# Patient Record
Sex: Female | Born: 1945 | Race: White | Hispanic: No | Marital: Married | Smoking: Never smoker
Health system: Southern US, Community
[De-identification: ages and names within clinical notes are randomized; demographics above are authoritative.]

## PROBLEM LIST (undated history)

## (undated) DIAGNOSIS — K219 Gastro-esophageal reflux disease without esophagitis: Secondary | ICD-10-CM

## (undated) DIAGNOSIS — D649 Anemia, unspecified: Secondary | ICD-10-CM

## (undated) DIAGNOSIS — I639 Cerebral infarction, unspecified: Secondary | ICD-10-CM

## (undated) DIAGNOSIS — C801 Malignant (primary) neoplasm, unspecified: Secondary | ICD-10-CM

## (undated) DIAGNOSIS — I10 Essential (primary) hypertension: Secondary | ICD-10-CM

## (undated) DIAGNOSIS — E785 Hyperlipidemia, unspecified: Secondary | ICD-10-CM

## (undated) DIAGNOSIS — Z972 Presence of dental prosthetic device (complete) (partial): Secondary | ICD-10-CM

## (undated) HISTORY — PX: BACK SURGERY: SHX140

## (undated) HISTORY — PX: HEMORROIDECTOMY: SUR656

## (undated) HISTORY — DX: Anemia, unspecified: D64.9

---

## 2005-07-06 ENCOUNTER — Emergency Department: Payer: Self-pay | Admitting: Emergency Medicine

## 2005-07-06 ENCOUNTER — Other Ambulatory Visit: Payer: Self-pay

## 2005-08-20 ENCOUNTER — Ambulatory Visit: Payer: Self-pay | Admitting: Family Medicine

## 2006-01-01 ENCOUNTER — Ambulatory Visit: Payer: Self-pay | Admitting: Unknown Physician Specialty

## 2006-11-23 ENCOUNTER — Emergency Department: Payer: Self-pay | Admitting: Emergency Medicine

## 2007-04-06 ENCOUNTER — Ambulatory Visit: Payer: Self-pay | Admitting: Unknown Physician Specialty

## 2007-06-28 ENCOUNTER — Ambulatory Visit: Payer: Self-pay | Admitting: Internal Medicine

## 2007-06-28 ENCOUNTER — Other Ambulatory Visit: Payer: Self-pay

## 2007-06-29 ENCOUNTER — Ambulatory Visit: Payer: Self-pay | Admitting: Internal Medicine

## 2007-07-19 ENCOUNTER — Ambulatory Visit: Payer: Self-pay | Admitting: Gastroenterology

## 2007-07-20 ENCOUNTER — Ambulatory Visit: Payer: Self-pay | Admitting: Gastroenterology

## 2008-04-19 ENCOUNTER — Ambulatory Visit: Payer: Self-pay | Admitting: Unknown Physician Specialty

## 2008-08-10 ENCOUNTER — Ambulatory Visit: Payer: Self-pay | Admitting: Family Medicine

## 2008-08-18 ENCOUNTER — Ambulatory Visit: Payer: Self-pay | Admitting: Family Medicine

## 2009-06-13 ENCOUNTER — Ambulatory Visit: Payer: Self-pay | Admitting: Unknown Physician Specialty

## 2010-09-24 ENCOUNTER — Observation Stay: Payer: Self-pay | Admitting: Internal Medicine

## 2010-12-17 ENCOUNTER — Ambulatory Visit: Payer: Self-pay | Admitting: Family Medicine

## 2011-05-06 ENCOUNTER — Ambulatory Visit: Payer: Self-pay

## 2011-06-15 ENCOUNTER — Ambulatory Visit: Payer: Self-pay | Admitting: Internal Medicine

## 2011-10-15 ENCOUNTER — Ambulatory Visit: Payer: Self-pay | Admitting: Internal Medicine

## 2011-11-17 ENCOUNTER — Ambulatory Visit: Payer: Self-pay | Admitting: Internal Medicine

## 2011-11-17 LAB — CBC CANCER CENTER
Basophil #: 0 x10 3/mm (ref 0.0–0.1)
Eosinophil %: 5.2 %
HGB: 12.2 g/dL (ref 12.0–16.0)
Monocyte #: 0.4 x10 3/mm (ref 0.0–0.7)
Monocyte %: 8.1 %
Neutrophil %: 56.4 %
Platelet: 218 x10 3/mm (ref 150–440)
RDW: 11.8 % (ref 11.5–14.5)
WBC: 4.6 x10 3/mm (ref 3.6–11.0)

## 2011-11-17 LAB — RETICULOCYTES: Absolute Retic Count: 0.085 10*6/uL — ABNORMAL HIGH (ref 0.024–0.084)

## 2011-11-17 LAB — IRON AND TIBC
Iron Bind.Cap.(Total): 286 ug/dL (ref 250–450)
Iron Saturation: 21 %
Iron: 61 ug/dL (ref 50–170)

## 2011-11-17 LAB — FERRITIN: Ferritin (ARMC): 152 ng/mL (ref 8–388)

## 2011-12-08 LAB — CBC CANCER CENTER
Basophil #: 0 x10 3/mm (ref 0.0–0.1)
Basophil %: 0.9 %
Eosinophil #: 0.2 x10 3/mm (ref 0.0–0.7)
Eosinophil %: 5.5 %
Lymphocyte #: 1.2 x10 3/mm (ref 1.0–3.6)
Lymphocyte %: 28.9 %
MCH: 31.9 pg (ref 26.0–34.0)
MCHC: 32.8 g/dL (ref 32.0–36.0)
MCV: 97.1 fL (ref 80–100)
Monocyte %: 8.5 %
Platelet: 229 x10 3/mm (ref 150–440)
WBC: 4.3 x10 3/mm (ref 3.6–11.0)

## 2011-12-08 LAB — IRON AND TIBC
Iron Bind.Cap.(Total): 288 ug/dL (ref 250–450)
Iron: 75 ug/dL (ref 50–170)
Unbound Iron-Bind.Cap.: 213 ug/dL

## 2011-12-08 LAB — OCCULT BLOOD X 1 CARD TO LAB, STOOL: Occult Blood, Feces: NEGATIVE

## 2011-12-14 ENCOUNTER — Ambulatory Visit: Payer: Self-pay | Admitting: Internal Medicine

## 2012-02-02 ENCOUNTER — Ambulatory Visit: Payer: Self-pay | Admitting: Internal Medicine

## 2012-02-02 LAB — CBC CANCER CENTER
Basophil #: 0 x10 3/mm (ref 0.0–0.1)
Basophil %: 1.2 %
Eosinophil #: 0.3 x10 3/mm (ref 0.0–0.7)
Eosinophil %: 6.7 %
HCT: 37.8 % (ref 35.0–47.0)
HGB: 12.7 g/dL (ref 12.0–16.0)
Lymphocyte #: 1.3 x10 3/mm (ref 1.0–3.6)
MCH: 32.6 pg (ref 26.0–34.0)
MCHC: 33.5 g/dL (ref 32.0–36.0)
MCV: 97 fL (ref 80–100)
Monocyte #: 0.4 x10 3/mm (ref 0.2–0.9)
Monocyte %: 11 %
Neutrophil #: 1.8 x10 3/mm (ref 1.4–6.5)
RDW: 12.2 % (ref 11.5–14.5)

## 2012-02-03 LAB — IRON AND TIBC
Iron: 73 ug/dL (ref 50–170)
Unbound Iron-Bind.Cap.: 217 ug/dL

## 2012-02-13 ENCOUNTER — Ambulatory Visit: Payer: Self-pay | Admitting: Internal Medicine

## 2012-03-16 ENCOUNTER — Ambulatory Visit: Payer: Self-pay | Admitting: Internal Medicine

## 2012-03-31 LAB — OCCULT BLOOD X 1 CARD TO LAB, STOOL
Occult Blood, Feces: NEGATIVE
Occult Blood, Feces: NEGATIVE
Occult Blood, Feces: NEGATIVE

## 2012-04-14 ENCOUNTER — Ambulatory Visit: Payer: Self-pay | Admitting: Internal Medicine

## 2013-04-19 ENCOUNTER — Ambulatory Visit: Payer: Self-pay | Admitting: Family Medicine

## 2014-02-23 DIAGNOSIS — Z85828 Personal history of other malignant neoplasm of skin: Secondary | ICD-10-CM | POA: Insufficient documentation

## 2014-09-11 DIAGNOSIS — F32A Depression, unspecified: Secondary | ICD-10-CM | POA: Insufficient documentation

## 2014-10-15 ENCOUNTER — Emergency Department: Payer: Self-pay | Admitting: Emergency Medicine

## 2014-10-15 LAB — CBC
HCT: 36.9 % (ref 35.0–47.0)
HGB: 12.1 g/dL (ref 12.0–16.0)
MCH: 31.5 pg (ref 26.0–34.0)
MCHC: 32.9 g/dL (ref 32.0–36.0)
MCV: 96 fL (ref 80–100)
Platelet: 198 10*3/uL (ref 150–440)
RBC: 3.85 10*6/uL (ref 3.80–5.20)
RDW: 13 % (ref 11.5–14.5)
WBC: 5.5 10*3/uL (ref 3.6–11.0)

## 2014-10-15 LAB — BASIC METABOLIC PANEL
ANION GAP: 5 — AB (ref 7–16)
BUN: 12 mg/dL (ref 7–18)
CO2: 28 mmol/L (ref 21–32)
Calcium, Total: 8.8 mg/dL (ref 8.5–10.1)
Chloride: 106 mmol/L (ref 98–107)
Creatinine: 0.86 mg/dL (ref 0.60–1.30)
EGFR (African American): >60
Glucose: 91 mg/dL (ref 65–99)
Osmolality: 277 (ref 275–301)
Potassium: 3.6 mmol/L (ref 3.5–5.1)
Sodium: 139 mmol/L (ref 136–145)

## 2014-10-15 LAB — HEPATIC FUNCTION PANEL A (ARMC)
ALBUMIN: 3.4 g/dL (ref 3.4–5.0)
ALT: 15 U/L (ref 14–63)
Alkaline Phosphatase: 68 U/L (ref 46–116)
BILIRUBIN TOTAL: 0.2 mg/dL (ref 0.2–1.0)
Bilirubin, Direct: <0.1 mg/dL (ref 0.0–0.2)
SGOT(AST): 24 U/L (ref 15–37)
TOTAL PROTEIN: 6.8 g/dL (ref 6.4–8.2)

## 2014-10-15 LAB — TROPONIN I: Troponin-I: <0.02 ng/mL

## 2014-10-15 LAB — LIPASE, BLOOD: Lipase: 91 U/L (ref 73–393)

## 2014-10-30 DIAGNOSIS — R079 Chest pain, unspecified: Secondary | ICD-10-CM | POA: Insufficient documentation

## 2014-12-04 ENCOUNTER — Ambulatory Visit: Payer: Self-pay | Admitting: Cardiology

## 2015-02-05 ENCOUNTER — Other Ambulatory Visit: Payer: Self-pay | Admitting: Family Medicine

## 2015-02-05 DIAGNOSIS — Z1231 Encounter for screening mammogram for malignant neoplasm of breast: Secondary | ICD-10-CM

## 2015-05-31 ENCOUNTER — Encounter: Payer: Self-pay | Admitting: *Deleted

## 2015-06-03 ENCOUNTER — Encounter
Admission: RE | Disposition: A | Discharge status: Home / Self Care | Payer: Self-pay | Source: Ambulatory Visit | Attending: Gastroenterology

## 2015-06-03 ENCOUNTER — Ambulatory Visit
Admission: RE | Admit: 2015-06-03 | Discharge: 2015-06-03 | Disposition: A | Discharge status: Home / Self Care | Payer: Medicare HMO | Source: Ambulatory Visit | Attending: Gastroenterology | Admitting: Gastroenterology

## 2015-06-03 ENCOUNTER — Encounter: Payer: Self-pay | Admitting: Anesthesiology

## 2015-06-03 ENCOUNTER — Ambulatory Visit: Payer: Medicare HMO | Admitting: Anesthesiology

## 2015-06-03 DIAGNOSIS — K219 Gastro-esophageal reflux disease without esophagitis: Secondary | ICD-10-CM | POA: Insufficient documentation

## 2015-06-03 DIAGNOSIS — Z7982 Long term (current) use of aspirin: Secondary | ICD-10-CM | POA: Diagnosis not present

## 2015-06-03 DIAGNOSIS — Z1211 Encounter for screening for malignant neoplasm of colon: Secondary | ICD-10-CM | POA: Insufficient documentation

## 2015-06-03 DIAGNOSIS — Z79899 Other long term (current) drug therapy: Secondary | ICD-10-CM | POA: Diagnosis not present

## 2015-06-03 DIAGNOSIS — I10 Essential (primary) hypertension: Secondary | ICD-10-CM | POA: Insufficient documentation

## 2015-06-03 DIAGNOSIS — E785 Hyperlipidemia, unspecified: Secondary | ICD-10-CM | POA: Insufficient documentation

## 2015-06-03 HISTORY — PX: COLONOSCOPY WITH PROPOFOL: SHX5780

## 2015-06-03 HISTORY — DX: Hyperlipidemia, unspecified: E78.5

## 2015-06-03 HISTORY — DX: Gastro-esophageal reflux disease without esophagitis: K21.9

## 2015-06-03 HISTORY — DX: Essential (primary) hypertension: I10

## 2015-06-03 SURGERY — COLONOSCOPY WITH PROPOFOL
Anesthesia: General

## 2015-06-03 MED ORDER — SODIUM CHLORIDE 0.9 % IV SOLN: INTRAVENOUS | Status: DC

## 2015-06-03 MED ORDER — LIDOCAINE HCL (CARDIAC) 20 MG/ML IV SOLN
INTRAVENOUS | Status: DC | PRN
  Administered 2015-06-03: 60 mg via INTRAVENOUS

## 2015-06-03 MED ORDER — PROPOFOL 10 MG/ML IV BOLUS
INTRAVENOUS | Status: DC | PRN
  Administered 2015-06-03: 50 mg via INTRAVENOUS

## 2015-06-03 MED ORDER — SODIUM CHLORIDE 0.9 % IV SOLN
INTRAVENOUS | Status: DC
Start: 2015-06-03 — End: 2015-06-03
  Administered 2015-06-03: 1000 mL via INTRAVENOUS

## 2015-06-03 MED ORDER — PROPOFOL INFUSION 10 MG/ML OPTIME
INTRAVENOUS | Status: DC | PRN
  Administered 2015-06-03: 140 ug/kg/min via INTRAVENOUS

## 2015-06-03 MED ORDER — PROPOFOL INFUSION 10 MG/ML OPTIME: INTRAVENOUS | Status: DC | PRN

## 2015-06-03 NOTE — H&P (Signed)
    Primary Care Physician:  Sherrin Daisy, MD Primary Gastroenterologist:  Dr. Candace Cruise  Pre-Procedure History & Physical: HPI:  Kelsey Ibarra is a 69 y.o. female is here for an colonoscopy.   Past Medical History  Diagnosis Date  . Hypertension   . GERD (gastroesophageal reflux disease)   . Hyperlipidemia     No past surgical history on file.  Prior to Admission medications   Medication Sig Start Date End Date Taking? Authorizing Provider  acetaminophen-codeine (TYLENOL #4) 300-60 MG per tablet Take 1 tablet by mouth every 4 (four) hours as needed for pain.   Yes Historical Provider, MD  aspirin 325 MG tablet Take 325 mg by mouth daily.   Yes Historical Provider, MD  benazepril (LOTENSIN) 20 MG tablet Take 20 mg by mouth daily.   Yes Historical Provider, MD  ergocalciferol (VITAMIN D2) 50000 UNITS capsule Take 50,000 Units by mouth once a week.   Yes Historical Provider, MD  meclizine (ANTIVERT) 25 MG tablet Take 25 mg by mouth 3 (three) times daily as needed for dizziness.   Yes Historical Provider, MD  nitroGLYCERIN (NITROSTAT) 0.4 MG SL tablet Place 0.4 mg under the tongue every 5 (five) minutes as needed for chest pain.   Yes Historical Provider, MD    Allergies as of 04/25/2015  . (Not on File)    No family history on file.  Social History   Social History  . Marital Status: Widowed    Spouse Name: N/A  . Number of Children: N/A  . Years of Education: N/A   Occupational History  . Not on file.   Social History Main Topics  . Smoking status: Not on file  . Smokeless tobacco: Not on file  . Alcohol Use: Not on file  . Drug Use: Not on file  . Sexual Activity: Not on file   Other Topics Concern  . Not on file   Social History Narrative  . No narrative on file    Review of Systems: See HPI, otherwise negative ROS  Physical Exam: There were no vitals taken for this visit. General:   Alert,  pleasant and cooperative in NAD Head:  Normocephalic and  atraumatic. Neck:  Supple; no masses or thyromegaly. Lungs:  Clear throughout to auscultation.    Heart:  Regular rate and rhythm. Abdomen:  Soft, nontender and nondistended. Normal bowel sounds, without guarding, and without rebound.   Neurologic:  Alert and  oriented x4;  grossly normal neurologically.  Impression/Plan: Kelsey Ibarra is here for an colonoscopy to be performed for screening. Risks, benefits, limitations, and alternatives regarding colonoscopy have been reviewed with the patient.  Questions have been answered.  All parties agreeable.   Kelsey Ibarra, Kelsey Dawn, MD  06/03/2015, 9:00 AM

## 2015-06-03 NOTE — Op Note (Signed)
[facility] Patient Name: Hampton Cost Santoli Procedure Date: 06/03/2015 9:21 AM MRN: [id number] Account #: [id number] Date of Birth: 1946-07-15 Admit Type: Outpatient Age: 69 Room: [facility] ENDO ROOM 4 Gender: Female Note Status: Finalized Procedure:         Colonoscopy Indications:       Screening for colorectal malignant neoplasm Providers:         Lupita Dawn. Candace Cruise, MD Referring MD:      Forest Gleason Md, MD (Referring MD) Medicines:         Monitored Anesthesia Care Complications:     No immediate complications. Procedure:         Pre-Anesthesia Assessment:                    - Prior to the procedure, a History and Physical was                     performed, and patient medications, allergies and                     sensitivities were reviewed. The patient's tolerance of                     previous anesthesia was reviewed.                    - The risks and benefits of the procedure and the sedation                     options and risks were discussed with the patient. All                     questions were answered and informed consent was obtained.                    - After reviewing the risks and benefits, the patient was                     deemed in satisfactory condition to undergo the procedure.                    After obtaining informed consent, the colonoscope was                     passed under direct vision. Throughout the procedure, the                     patient's blood pressure, pulse, and oxygen saturations                     were monitored continuously. The Colonoscope was                     introduced through the anus and advanced to the the cecum,                     identified by appendiceal orifice and ileocecal valve. The                     colonoscopy was performed without difficulty. The patient                     tolerated the procedure well. The quality of the bowel  preparation was good. Findings:  The colon (entire examined portion) appeared normal. Impression:        - The entire examined colon is normal.                    - No specimens collected. Recommendation:    - Discharge patient to home.                    - The findings and recommendations were discussed with the                     patient. Procedure Code(s): --- Professional ---                    [phone], Colonoscopy, flexible; diagnostic, including                     collection of specimen(s) by brushing or washing, when                     performed (separate procedure) Diagnosis Code(s): --- Professional ---                    Z12.11, Encounter for screening for malignant neoplasm of                     colon CPT copyright 2014 American Medical Association. All rights reserved. The codes documented in this report are preliminary and upon coder review may  be revised to meet current compliance requirements. Hulen Luster, MD 06/03/2015 9:55:15 AM This report has been signed electronically. Number of Addenda: 0 Note Initiated On: 06/03/2015 9:21 AM Scope Withdrawal Time: 0 hours 6 minutes 55 seconds  Total Procedure Duration: 0 hours 10 minutes 45 seconds       [facility]

## 2015-06-03 NOTE — Transfer of Care (Signed)
Immediate Anesthesia Transfer of Care Note  Patient: Kelsey Ibarra Kleinert  Procedure(s) Performed: Procedure(s): COLONOSCOPY WITH PROPOFOL (N/A)  Patient Location: PACU  Anesthesia Type:General  Level of Consciousness: awake, alert  and oriented  Airway & Oxygen Therapy: Patient Spontanous Breathing and Patient connected to nasal cannula oxygen  Post-op Assessment: Report given to RN and Post -op Vital signs reviewed and stable  Post vital signs: Reviewed and stable  Last Vitals:  Filed Vitals:   06/03/15 1019  BP: 100/60  Pulse: 70  Temp: 36.3 C  Resp: 11    Complications: No apparent anesthesia complications

## 2015-06-03 NOTE — Anesthesia Preprocedure Evaluation (Signed)
Anesthesia Evaluation  Patient identified by MRN, date of birth, ID band Patient awake    Reviewed: Allergy & Precautions, H&P , NPO status , Patient's Chart, lab work & pertinent test results, reviewed documented beta blocker date and time   History of Anesthesia Complications Negative for: history of anesthetic complications  Airway Mallampati: III  TM Distance: >3 FB Neck ROM: full    Dental  (+) Poor Dentition   Pulmonary neg pulmonary ROS,    Pulmonary exam normal breath sounds clear to auscultation       Cardiovascular Exercise Tolerance: Good hypertension, On Medications (-) angina+ Past MI (per patient she "had a light one, but they didn't do anything about it and it didn't show up afterwards.")  (-) CAD, (-) Cardiac Stents and (-) CABG Normal cardiovascular exam(-) dysrhythmias  Rhythm:regular Rate:Normal     Neuro/Psych negative neurological ROS  negative psych ROS   GI/Hepatic Neg liver ROS, GERD  Medicated and Controlled,  Endo/Other  negative endocrine ROS  Renal/GU negative Renal ROS  negative genitourinary   Musculoskeletal   Abdominal   Peds  Hematology negative hematology ROS (+)   Anesthesia Other Findings Past Medical History:   Hypertension                                                 GERD (gastroesophageal reflux disease)                       Hyperlipidemia                                               Reproductive/Obstetrics negative OB ROS                             Anesthesia Physical Anesthesia Plan  ASA: II  Anesthesia Plan: General   Post-op Pain Management:    Induction:   Airway Management Planned:   Additional Equipment:   Intra-op Plan:   Post-operative Plan:   Informed Consent: I have reviewed the patients History and Physical, chart, labs and discussed the procedure including the risks, benefits and alternatives for the proposed  anesthesia with the patient or authorized representative who has indicated his/her understanding and acceptance.   Dental Advisory Given  Plan Discussed with: Anesthesiologist, CRNA and Surgeon  Anesthesia Plan Comments:         Anesthesia Quick Evaluation

## 2015-06-04 NOTE — Anesthesia Postprocedure Evaluation (Signed)
  Anesthesia Post-op Note  Patient: Kelsey Ibarra  Procedure(s) Performed: Procedure(s): COLONOSCOPY WITH PROPOFOL (N/A)  Anesthesia type:General  Patient location: PACU  Post pain: Pain level controlled  Post assessment: Post-op Vital signs reviewed, Patient's Cardiovascular Status Stable, Respiratory Function Stable, Patent Airway and No signs of Nausea or vomiting  Post vital signs: Reviewed and stable  Last Vitals:  Filed Vitals:   06/03/15 1050  BP: 138/67  Pulse: 68  Temp:   Resp: 12    Level of consciousness: awake, alert  and patient cooperative  Complications: No apparent anesthesia complications

## 2015-06-05 ENCOUNTER — Encounter: Payer: Self-pay | Admitting: Gastroenterology

## 2016-01-23 ENCOUNTER — Ambulatory Visit
Admission: RE | Admit: 2016-01-23 | Discharge: 2016-01-23 | Disposition: A | Discharge status: Home / Self Care | Payer: Medicare HMO | Source: Ambulatory Visit | Attending: Family Medicine | Admitting: Family Medicine

## 2016-01-23 DIAGNOSIS — Z1231 Encounter for screening mammogram for malignant neoplasm of breast: Secondary | ICD-10-CM | POA: Diagnosis not present

## 2016-01-23 HISTORY — DX: Malignant (primary) neoplasm, unspecified: C80.1

## 2016-12-04 ENCOUNTER — Other Ambulatory Visit: Payer: Self-pay | Admitting: Family Medicine

## 2016-12-04 DIAGNOSIS — Z1231 Encounter for screening mammogram for malignant neoplasm of breast: Secondary | ICD-10-CM

## 2017-01-25 ENCOUNTER — Ambulatory Visit
Admission: RE | Admit: 2017-01-25 | Discharge: 2017-01-25 | Disposition: A | Discharge status: Home / Self Care | Payer: Medicare HMO | Source: Ambulatory Visit | Attending: Family Medicine | Admitting: Family Medicine

## 2017-01-25 DIAGNOSIS — Z1231 Encounter for screening mammogram for malignant neoplasm of breast: Secondary | ICD-10-CM | POA: Insufficient documentation

## 2017-01-26 ENCOUNTER — Ambulatory Visit: Payer: Medicare HMO

## 2019-03-15 ENCOUNTER — Other Ambulatory Visit: Payer: Self-pay | Admitting: Family Medicine

## 2019-03-15 DIAGNOSIS — Z1231 Encounter for screening mammogram for malignant neoplasm of breast: Secondary | ICD-10-CM

## 2019-04-05 ENCOUNTER — Other Ambulatory Visit: Payer: Self-pay

## 2019-04-05 ENCOUNTER — Ambulatory Visit
Admission: RE | Admit: 2019-04-05 | Discharge: 2019-04-05 | Disposition: A | Discharge status: Home / Self Care | Payer: Medicare HMO | Source: Ambulatory Visit | Attending: Family Medicine | Admitting: Family Medicine

## 2019-04-05 DIAGNOSIS — Z1231 Encounter for screening mammogram for malignant neoplasm of breast: Secondary | ICD-10-CM | POA: Insufficient documentation

## 2019-09-05 ENCOUNTER — Ambulatory Visit
Admission: RE | Admit: 2019-09-05 | Discharge: 2019-09-05 | Disposition: A | Discharge status: Home / Self Care | Payer: Medicare HMO | Source: Ambulatory Visit | Attending: Family Medicine | Admitting: Family Medicine

## 2019-09-05 ENCOUNTER — Other Ambulatory Visit: Payer: Self-pay

## 2019-09-05 ENCOUNTER — Other Ambulatory Visit: Payer: Self-pay | Admitting: Family Medicine

## 2019-09-05 ENCOUNTER — Ambulatory Visit
Admission: RE | Admit: 2019-09-05 | Discharge: 2019-09-05 | Disposition: A | Discharge status: Home / Self Care | Payer: Medicare HMO | Attending: Family Medicine | Admitting: Family Medicine

## 2019-09-05 DIAGNOSIS — M79674 Pain in right toe(s): Secondary | ICD-10-CM | POA: Insufficient documentation

## 2020-05-22 ENCOUNTER — Other Ambulatory Visit: Payer: Self-pay | Admitting: Family Medicine

## 2020-05-22 DIAGNOSIS — Z1231 Encounter for screening mammogram for malignant neoplasm of breast: Secondary | ICD-10-CM

## 2020-05-28 ENCOUNTER — Ambulatory Visit
Admission: RE | Admit: 2020-05-28 | Discharge: 2020-05-28 | Disposition: A | Discharge status: Home / Self Care | Payer: Medicare HMO | Source: Ambulatory Visit | Attending: Family Medicine | Admitting: Family Medicine

## 2020-05-28 ENCOUNTER — Encounter (INDEPENDENT_AMBULATORY_CARE_PROVIDER_SITE_OTHER): Payer: Self-pay

## 2020-05-28 ENCOUNTER — Other Ambulatory Visit: Payer: Self-pay

## 2020-05-28 DIAGNOSIS — Z1231 Encounter for screening mammogram for malignant neoplasm of breast: Secondary | ICD-10-CM | POA: Insufficient documentation

## 2021-06-16 ENCOUNTER — Ambulatory Visit
Admission: RE | Admit: 2021-06-16 | Discharge: 2021-06-16 | Disposition: A | Discharge status: Home / Self Care | Payer: Medicare HMO | Source: Ambulatory Visit | Attending: Family Medicine | Admitting: Family Medicine

## 2021-06-16 ENCOUNTER — Other Ambulatory Visit: Payer: Self-pay | Admitting: Family Medicine

## 2021-06-16 ENCOUNTER — Other Ambulatory Visit: Payer: Self-pay

## 2021-06-16 ENCOUNTER — Ambulatory Visit
Admission: RE | Admit: 2021-06-16 | Discharge: 2021-06-16 | Disposition: A | Discharge status: Home / Self Care | Payer: Medicare HMO | Attending: Family Medicine | Admitting: Family Medicine

## 2021-06-16 DIAGNOSIS — M25532 Pain in left wrist: Secondary | ICD-10-CM

## 2021-07-07 ENCOUNTER — Other Ambulatory Visit: Payer: Self-pay | Admitting: Family Medicine

## 2021-07-07 DIAGNOSIS — Z1231 Encounter for screening mammogram for malignant neoplasm of breast: Secondary | ICD-10-CM

## 2021-07-30 ENCOUNTER — Ambulatory Visit
Admission: RE | Admit: 2021-07-30 | Discharge: 2021-07-30 | Disposition: A | Discharge status: Home / Self Care | Payer: Medicare HMO | Source: Ambulatory Visit | Attending: Family Medicine | Admitting: Family Medicine

## 2021-07-30 ENCOUNTER — Other Ambulatory Visit: Payer: Self-pay

## 2021-07-30 DIAGNOSIS — Z1231 Encounter for screening mammogram for malignant neoplasm of breast: Secondary | ICD-10-CM | POA: Insufficient documentation

## 2021-11-28 ENCOUNTER — Encounter: Payer: Self-pay | Admitting: *Deleted

## 2021-12-02 ENCOUNTER — Telehealth: Payer: Self-pay

## 2021-12-02 NOTE — Telephone Encounter (Signed)
Called patient to gp over any question for NP appt with Dr. Tasia Catchings at 3:15 on 3/22. Patient did not answer. Left message to call back if she has any questions or concerns.  ?

## 2021-12-03 ENCOUNTER — Inpatient Hospital Stay: Payer: Medicare HMO | Attending: Oncology | Admitting: Oncology

## 2021-12-03 ENCOUNTER — Other Ambulatory Visit: Payer: Self-pay

## 2021-12-03 ENCOUNTER — Inpatient Hospital Stay: Payer: Medicare HMO

## 2021-12-03 ENCOUNTER — Encounter: Payer: Self-pay | Admitting: Oncology

## 2021-12-03 VITALS — BP 137/59 | HR 75 | Temp 97.5°F | Resp 16 | Wt 110.8 lb

## 2021-12-03 DIAGNOSIS — Z85828 Personal history of other malignant neoplasm of skin: Secondary | ICD-10-CM | POA: Diagnosis not present

## 2021-12-03 DIAGNOSIS — K219 Gastro-esophageal reflux disease without esophagitis: Secondary | ICD-10-CM | POA: Insufficient documentation

## 2021-12-03 DIAGNOSIS — E785 Hyperlipidemia, unspecified: Secondary | ICD-10-CM | POA: Insufficient documentation

## 2021-12-03 DIAGNOSIS — R768 Other specified abnormal immunological findings in serum: Secondary | ICD-10-CM | POA: Diagnosis not present

## 2021-12-03 DIAGNOSIS — N1832 Chronic kidney disease, stage 3b: Secondary | ICD-10-CM | POA: Insufficient documentation

## 2021-12-03 DIAGNOSIS — D649 Anemia, unspecified: Secondary | ICD-10-CM

## 2021-12-03 DIAGNOSIS — I10 Essential (primary) hypertension: Secondary | ICD-10-CM | POA: Insufficient documentation

## 2021-12-03 DIAGNOSIS — Z803 Family history of malignant neoplasm of breast: Secondary | ICD-10-CM | POA: Insufficient documentation

## 2021-12-03 LAB — TSH: TSH: 0.786 u[IU]/mL (ref 0.350–4.500)

## 2021-12-03 LAB — COMPREHENSIVE METABOLIC PANEL
ALT: 8 U/L (ref 0–44)
AST: 17 U/L (ref 15–41)
Albumin: 3.7 g/dL (ref 3.5–5.0)
Alkaline Phosphatase: 70 U/L (ref 38–126)
Anion gap: 6 (ref 5–15)
BUN: 17 mg/dL (ref 8–23)
CO2: 26 mmol/L (ref 22–32)
Calcium: 8.6 mg/dL — ABNORMAL LOW (ref 8.9–10.3)
Chloride: 103 mmol/L (ref 98–111)
Creatinine, Ser: 1.16 mg/dL — ABNORMAL HIGH (ref 0.44–1.00)
GFR, Estimated: 48 mL/min — ABNORMAL LOW (ref >60)
Glucose, Bld: 110 mg/dL — ABNORMAL HIGH (ref 70–99)
Potassium: 3.8 mmol/L (ref 3.5–5.1)
Sodium: 135 mmol/L (ref 135–145)
Total Bilirubin: 0.1 mg/dL — ABNORMAL LOW (ref 0.3–1.2)
Total Protein: 6.9 g/dL (ref 6.5–8.1)

## 2021-12-03 LAB — CBC WITH DIFFERENTIAL/PLATELET
Abs Immature Granulocytes: 0.01 10*3/uL (ref 0.00–0.07)
Basophils Absolute: 0 10*3/uL (ref 0.0–0.1)
Basophils Relative: 1 %
Eosinophils Absolute: 0.3 10*3/uL (ref 0.0–0.5)
Eosinophils Relative: 8 %
HCT: 29.8 % — ABNORMAL LOW (ref 36.0–46.0)
Hemoglobin: 9.6 g/dL — ABNORMAL LOW (ref 12.0–15.0)
Immature Granulocytes: 0 %
Lymphocytes Relative: 26 %
Lymphs Abs: 1.1 10*3/uL (ref 0.7–4.0)
MCH: 31.1 pg (ref 26.0–34.0)
MCHC: 32.2 g/dL (ref 30.0–36.0)
MCV: 96.4 fL (ref 80.0–100.0)
Monocytes Absolute: 0.4 10*3/uL (ref 0.1–1.0)
Monocytes Relative: 9 %
Neutro Abs: 2.3 10*3/uL (ref 1.7–7.7)
Neutrophils Relative %: 56 %
Platelets: 167 10*3/uL (ref 150–400)
RBC: 3.09 MIL/uL — ABNORMAL LOW (ref 3.87–5.11)
RDW: 13.3 % (ref 11.5–15.5)
WBC: 4.1 10*3/uL (ref 4.0–10.5)
nRBC: 0 % (ref 0.0–0.2)

## 2021-12-03 LAB — LACTATE DEHYDROGENASE: LDH: 134 U/L (ref 98–192)

## 2021-12-03 LAB — TECHNOLOGIST SMEAR REVIEW: Plt Morphology: ADEQUATE

## 2021-12-03 LAB — RETIC PANEL
Immature Retic Fract: 7.4 % (ref 2.3–15.9)
RBC.: 3.15 MIL/uL — ABNORMAL LOW (ref 3.87–5.11)
Retic Count, Absolute: 35.9 10*3/uL (ref 19.0–186.0)
Retic Ct Pct: 1.1 % (ref 0.4–3.1)
Reticulocyte Hemoglobin: 34.4 pg (ref >27.9)

## 2021-12-03 LAB — FOLATE: Folate: 12.8 ng/mL (ref >5.9)

## 2021-12-03 NOTE — Progress Notes (Signed)
Pt and daughter in for New patient visit for Anemia.  Pt referred by Dr Clemmie Krill. Pt reports being very fatigued all the time.  ?

## 2021-12-03 NOTE — Progress Notes (Signed)
?Hematology/Oncology Consult note ?[phone] [phone] ?  ? ?   ? ? ?Patient Care Team: ?[name], MD as PCP - General (Family Medicine) ?Earlie Server, MD as Consulting Physician (Oncology) ? ?REFERRING PROVIDER: ?[name], MD  ?CHIEF COMPLAINTS/REASON FOR VISIT:  ?Evaluation of anemia ? ?HISTORY OF PRESENTING ILLNESS:  ? ?Kelsey Ibarra is a  76 y.o.  female with PMH listed below was seen in consultation at the request of  [name], MD  for evaluation of anemia.  ? ?09/30/21 ferritin 84  Hb 10.4, Creatinine 1.14,  ?11/04/21  ferritin 96 ?11/12/2021  iron 239, iron saturation 21, vitamin B12 528, cbc showed Hb 9.9, MCV 94, wbc 4.2, normal differentials, platelet 183,000.  ? ?Patient reports fatigue. No bleeding events. + arthralgia + stiffness not more than 20 mins ?06/03/2015 colonoscopy showed normal examination.  ? ?Review of Systems  ?Constitutional:  Positive for fatigue. Negative for appetite change, chills and fever.  ?HENT:   Negative for hearing loss and voice change.   ?Eyes:  Negative for eye problems.  ?Respiratory:  Negative for chest tightness and cough.   ?Cardiovascular:  Negative for chest pain.  ?Gastrointestinal:  Negative for abdominal distention, abdominal pain and blood in stool.  ?Endocrine: Negative for hot flashes.  ?Genitourinary:  Negative for difficulty urinating and frequency.   ?Musculoskeletal:  Positive for arthralgias.  ?     Hand joints and knee pain  ?Skin:  Negative for itching and rash.  ?Neurological:  Negative for extremity weakness.  ?Hematological:  Negative for adenopathy.  ?Psychiatric/Behavioral:  Negative for confusion.   ? ?MEDICAL HISTORY:  ?Past Medical History:  ?Diagnosis Date  ? Anemia   ? Cancer [facility])   ? skin ca  ? GERD (gastroesophageal reflux disease)   ? Hyperlipidemia   ? Hypertension   ? ? ?SURGICAL HISTORY: ?Past Surgical History:  ?Procedure Laterality Date  ? BACK SURGERY    ? COLONOSCOPY WITH PROPOFOL N/A 06/03/2015  ?  Procedure: COLONOSCOPY WITH PROPOFOL;  Surgeon: Hulen Luster, MD;  Location: [facility] ENDOSCOPY;  Service: Gastroenterology;  Laterality: N/A;  ? HEMORROIDECTOMY    ? ? ?SOCIAL HISTORY: ?Social History  ? ?Socioeconomic History  ? Marital status: Married  ?  Spouse name: Not on file  ? Number of children: Not on file  ? Years of education: Not on file  ? Highest education level: Not on file  ?Occupational History  ? Occupation: Actuary  ?Tobacco Use  ? Smoking status: Never  ? Smokeless tobacco: Never  ?Vaping Use  ? Vaping Use: Never used  ?Substance and Sexual Activity  ? Alcohol use: Never  ? Drug use: Never  ? Sexual activity: Not Currently  ?Other Topics Concern  ? Not on file  ?Social History Narrative  ? Not on file  ? ?Social Determinants of Health  ? ?Financial Resource Strain: Not on file  ?Food Insecurity: Not on file  ?Transportation Needs: Not on file  ?Physical Activity: Not on file  ?Stress: Not on file  ?Social Connections: Not on file  ?Intimate Partner Violence: Not on file  ? ? ?FAMILY HISTORY: ?Family History  ?Problem Relation Age of Onset  ? Heart attack Father   ? Lupus Sister   ? Multiple sclerosis Sister   ? ALS Brother   ? Breast cancer Other   ? ? ?ALLERGIES:  is allergic to neurontin [gabapentin] and zocor [simvastatin]. ? ?MEDICATIONS:  ?Current Outpatient Medications  ?Medication Sig Dispense Refill  ?  benazepril (LOTENSIN) 20 MG tablet Take 20 mg by mouth daily.    ? citalopram (CELEXA) 10 MG tablet Take 10 mg by mouth daily.    ? meclizine (ANTIVERT) 25 MG tablet Take 25 mg by mouth 3 (three) times daily as needed for dizziness.    ? nitroGLYCERIN (NITROSTAT) 0.4 MG SL tablet Place 0.4 mg under the tongue every 5 (five) minutes as needed for chest pain.    ? omeprazole (PRILOSEC) 40 MG capsule Take 40 mg by mouth daily.    ? pravastatin (PRAVACHOL) 20 MG tablet Take 20 mg by mouth daily.    ? traMADol (ULTRAM) 50 MG tablet Take 50 mg by mouth 3 (three) times daily as needed.    ? ?No current  facility-administered medications for this visit.  ? ? ? ?PHYSICAL EXAMINATION: ?ECOG PERFORMANCE STATUS: 1 - Symptomatic but completely ambulatory ?Vitals:  ? 12/03/21 1533  ?BP: (!) 137/59  ?Pulse: 75  ?Resp: 16  ?Temp: (!) 97.5 ?F (36.4 ?C)  ?SpO2: 97%  ? ?Filed Weights  ? 12/03/21 1533  ?Weight: 110 lb 12.8 oz (50.3 kg)  ? ? ?Physical Exam ?Constitutional:   ?   General: She is not in acute distress. ?HENT:  ?   Head: Normocephalic and atraumatic.  ?Eyes:  ?   General: No scleral icterus. ?Cardiovascular:  ?   Rate and Rhythm: Normal rate and regular rhythm.  ?   Heart sounds: Normal heart sounds.  ?Pulmonary:  ?   Effort: Pulmonary effort is normal. No respiratory distress.  ?   Breath sounds: No wheezing.  ?Abdominal:  ?   General: Bowel sounds are normal. There is no distension.  ?   Palpations: Abdomen is soft.  ?Musculoskeletal:     ?   General: Normal range of motion.  ?   Cervical back: Normal range of motion and neck supple.  ?   Comments: Prominent interphalangeal joints   ?Skin: ?   General: Skin is warm and dry.  ?   Findings: No erythema or rash.  ?Neurological:  ?   Mental Status: She is alert and oriented to person, place, and time. Mental status is at baseline.  ?   Cranial Nerves: No cranial nerve deficit.  ?   Coordination: Coordination normal.  ?Psychiatric:     ?   Mood and Affect: Mood normal.  ? ? ?LABORATORY DATA:  ?I have reviewed the data as listed ?Lab Results  ?Component Value Date  ? WBC 4.1 12/03/2021  ? HGB 9.6 (L) 12/03/2021  ? HCT 29.8 (L) 12/03/2021  ? MCV 96.4 12/03/2021  ? PLT 167 12/03/2021  ? ?Recent Labs  ?  12/03/21 ?3295  ?NA 135  ?K 3.8  ?CL 103  ?CO2 26  ?GLUCOSE 110*  ?BUN 17  ?CREATININE 1.16*  ?CALCIUM 8.6*  ?GFRNONAA 48*  ?PROT 6.9  ?ALBUMIN 3.7  ?AST 17  ?ALT 8  ?ALKPHOS 70  ?BILITOT 0.1*  ? ?Iron/TIBC/Ferritin/ %Sat ?   ?Component Value Date/Time  ? IRON 73 02/02/2012 1444  ? TIBC 290 02/02/2012 1444  ? FERRITIN 152 11/17/2011 1505  ? IRONPCTSAT 25 02/02/2012 1444   ?  ? ? ?RADIOGRAPHIC STUDIES: ?I have personally reviewed the radiological images as listed and agreed with the findings in the report. ?No results found. ? ? ? ?ASSESSMENT & PLAN:  ?1. Normocytic anemia   ?2. Stage 3b chronic kidney disease ([place])   ? ?Anemia due to chronic kidney disease, rule out other etiologies.  ?  Check cbc, smear, retic panel, multiple myeloma panel, light chain ration, ANA with reflex, LDH, TSH.  ? ?# positive ANA 1:1280 suspect underlying autoimmune disease.  ? ?Orders Placed This Encounter  ?Procedures  ? Technologist smear review  ?  Standing Status:   Future  ?  Number of Occurrences:   1  ?  Standing Expiration Date:   12/04/2022  ? Comprehensive metabolic panel  ?  Standing Status:   Future  ?  Number of Occurrences:   1  ?  Standing Expiration Date:   12/04/2022  ? CBC with Differential/Platelet  ?  Standing Status:   Future  ?  Number of Occurrences:   1  ?  Standing Expiration Date:   12/04/2022  ? Retic Panel  ?  Standing Status:   Future  ?  Number of Occurrences:   1  ?  Standing Expiration Date:   12/04/2022  ? Multiple Myeloma Panel (SPEP&IFE w/QIG)  ?  Standing Status:   Future  ?  Number of Occurrences:   1  ?  Standing Expiration Date:   12/04/2022  ? Kappa/lambda light chains  ?  Standing Status:   Future  ?  Number of Occurrences:   1  ?  Standing Expiration Date:   12/04/2022  ? ANA, IFA (with reflex)  ?  Standing Status:   Future  ?  Number of Occurrences:   1  ?  Standing Expiration Date:   12/04/2022  ? Folate  ?  Standing Status:   Future  ?  Number of Occurrences:   1  ?  Standing Expiration Date:   12/04/2022  ? Lactate dehydrogenase  ?  Standing Status:   Future  ?  Number of Occurrences:   1  ?  Standing Expiration Date:   12/04/2022  ? TSH  ?  Standing Status:   Future  ?  Number of Occurrences:   1  ?  Standing Expiration Date:   12/04/2022  ?  ?All questions were answered. The patient knows to call the clinic with any problems questions or concerns. ? ?cc ?[name],  MD  ? ? ?Return of visit: 3-4 weeks to review results.  ?Thank you for this kind referral and the opportunity to participate in the care of this patient. A copy of today's note is routed to referring pr

## 2021-12-04 LAB — KAPPA/LAMBDA LIGHT CHAINS
Kappa free light chain: 60.4 mg/L — ABNORMAL HIGH (ref 3.3–19.4)
Kappa, lambda light chain ratio: 2.1 — ABNORMAL HIGH (ref 0.26–1.65)
Lambda free light chains: 28.8 mg/L — ABNORMAL HIGH (ref 5.7–26.3)

## 2021-12-05 LAB — ANTINUCLEAR ANTIBODIES, IFA: ANA Ab, IFA: POSITIVE — AB

## 2021-12-05 LAB — FANA STAINING PATTERNS: Nucleolar Pattern: 8910 — ABNORMAL HIGH

## 2021-12-08 LAB — MULTIPLE MYELOMA PANEL, SERUM
Albumin SerPl Elph-Mcnc: 3.6 g/dL (ref 2.9–4.4)
Albumin/Glob SerPl: 1.4 (ref 0.7–1.7)
Alpha 1: 0.2 g/dL (ref 0.0–0.4)
Alpha2 Glob SerPl Elph-Mcnc: 0.6 g/dL (ref 0.4–1.0)
B-Globulin SerPl Elph-Mcnc: 0.7 g/dL (ref 0.7–1.3)
Gamma Glob SerPl Elph-Mcnc: 1.2 g/dL (ref 0.4–1.8)
Globulin, Total: 2.7 g/dL (ref 2.2–3.9)
IgA: 52 mg/dL — ABNORMAL LOW (ref 64–422)
IgG (Immunoglobin G), Serum: 1350 mg/dL (ref 586–1602)
IgM (Immunoglobulin M), Srm: 21 mg/dL — ABNORMAL LOW (ref 26–217)
Total Protein ELP: 6.3 g/dL (ref 6.0–8.5)

## 2021-12-25 ENCOUNTER — Other Ambulatory Visit: Payer: Self-pay

## 2021-12-25 ENCOUNTER — Inpatient Hospital Stay: Payer: Medicare HMO | Attending: Oncology | Admitting: Oncology

## 2021-12-25 ENCOUNTER — Encounter: Payer: Self-pay | Admitting: Oncology

## 2021-12-25 VITALS — BP 131/45 | HR 73 | Temp 98.7°F | Resp 20 | Wt 110.1 lb

## 2021-12-25 DIAGNOSIS — D649 Anemia, unspecified: Secondary | ICD-10-CM | POA: Diagnosis not present

## 2021-12-25 DIAGNOSIS — N1832 Chronic kidney disease, stage 3b: Secondary | ICD-10-CM | POA: Diagnosis not present

## 2021-12-25 DIAGNOSIS — D631 Anemia in chronic kidney disease: Secondary | ICD-10-CM | POA: Diagnosis present

## 2021-12-25 DIAGNOSIS — R768 Other specified abnormal immunological findings in serum: Secondary | ICD-10-CM | POA: Diagnosis not present

## 2021-12-25 DIAGNOSIS — Z803 Family history of malignant neoplasm of breast: Secondary | ICD-10-CM | POA: Insufficient documentation

## 2021-12-25 NOTE — Progress Notes (Signed)
?Hematology/Oncology Consult note ?[phone] [phone] ?  ? ?   ? ? ?Patient Care Team: ?[name], MD as PCP - General (Family Medicine) ?Earlie Server, MD as Consulting Physician (Oncology) ? ?REFERRING PROVIDER: ?[name], MD  ?CHIEF COMPLAINTS/REASON FOR VISIT:  ?Follow-up for anemia, ? ?HISTORY OF PRESENTING ILLNESS:  ? ?Kelsey Ibarra is a  76 y.o.  female with PMH listed below was seen in consultation at the request of  [name], MD  for evaluation of anemia.  ? ?09/30/21 ferritin 84  Hb 10.4, Creatinine 1.14,  ?11/04/21  ferritin 96 ?11/12/2021  iron 239, iron saturation 21, vitamin B12 528, cbc showed Hb 9.9, MCV 94, wbc 4.2, normal differentials, platelet 183,000.  ? ?Patient reports fatigue. No bleeding events. + arthralgia + stiffness not more than 20 mins ?06/03/2015 colonoscopy showed normal examination.  ? ?INTERVAL HISTORY ?Kelsey Ibarra is a 76 y.o. female who has above history reviewed by me today presents for follow up visit to review results ?No new complaints. ?Patient reports positive family history of autoimmune disease. ? ?Review of Systems  ?Constitutional:  Positive for fatigue. Negative for appetite change, chills and fever.  ?HENT:   Negative for hearing loss and voice change.   ?Eyes:  Negative for eye problems.  ?Respiratory:  Negative for chest tightness and cough.   ?Cardiovascular:  Negative for chest pain.  ?Gastrointestinal:  Negative for abdominal distention, abdominal pain and blood in stool.  ?Endocrine: Negative for hot flashes.  ?Genitourinary:  Negative for difficulty urinating and frequency.   ?Musculoskeletal:  Positive for arthralgias.  ?     Hand joints and knee pain  ?Skin:  Negative for itching and rash.  ?Neurological:  Negative for extremity weakness.  ?Hematological:  Negative for adenopathy.  ?Psychiatric/Behavioral:  Negative for confusion.   ? ?MEDICAL HISTORY:  ?Past Medical History:  ?Diagnosis Date  ? Anemia   ? Cancer [facility])   ?  skin ca  ? GERD (gastroesophageal reflux disease)   ? Hyperlipidemia   ? Hypertension   ? ? ?SURGICAL HISTORY: ?Past Surgical History:  ?Procedure Laterality Date  ? BACK SURGERY    ? COLONOSCOPY WITH PROPOFOL N/A 06/03/2015  ? Procedure: COLONOSCOPY WITH PROPOFOL;  Surgeon: Hulen Luster, MD;  Location: [facility] ENDOSCOPY;  Service: Gastroenterology;  Laterality: N/A;  ? HEMORROIDECTOMY    ? ? ?SOCIAL HISTORY: ?Social History  ? ?Socioeconomic History  ? Marital status: Married  ?  Spouse name: Not on file  ? Number of children: Not on file  ? Years of education: Not on file  ? Highest education level: Not on file  ?Occupational History  ? Occupation: Actuary  ?Tobacco Use  ? Smoking status: Never  ? Smokeless tobacco: Never  ?Vaping Use  ? Vaping Use: Never used  ?Substance and Sexual Activity  ? Alcohol use: Never  ? Drug use: Never  ? Sexual activity: Not Currently  ?Other Topics Concern  ? Not on file  ?Social History Narrative  ? Not on file  ? ?Social Determinants of Health  ? ?Financial Resource Strain: Not on file  ?Food Insecurity: Not on file  ?Transportation Needs: Not on file  ?Physical Activity: Not on file  ?Stress: Not on file  ?Social Connections: Not on file  ?Intimate Partner Violence: Not on file  ? ? ?FAMILY HISTORY: ?Family History  ?Problem Relation Age of Onset  ? Heart attack Father   ? Lupus Sister   ? Multiple  sclerosis Sister   ? ALS Brother   ? Breast cancer Other   ? ? ?ALLERGIES:  is allergic to neurontin [gabapentin] and zocor [simvastatin]. ? ?MEDICATIONS:  ?Current Outpatient Medications  ?Medication Sig Dispense Refill  ? benazepril (LOTENSIN) 20 MG tablet Take 20 mg by mouth daily.    ? citalopram (CELEXA) 10 MG tablet Take 10 mg by mouth daily.    ? meclizine (ANTIVERT) 25 MG tablet Take 25 mg by mouth 3 (three) times daily as needed for dizziness.    ? nitroGLYCERIN (NITROSTAT) 0.4 MG SL tablet Place 0.4 mg under the tongue every 5 (five) minutes as needed for chest pain.    ? omeprazole  (PRILOSEC) 40 MG capsule Take 40 mg by mouth daily.    ? pravastatin (PRAVACHOL) 20 MG tablet Take 20 mg by mouth daily.    ? traMADol (ULTRAM) 50 MG tablet Take 50 mg by mouth 3 (three) times daily as needed.    ? ?No current facility-administered medications for this visit.  ? ? ? ?PHYSICAL EXAMINATION: ?ECOG PERFORMANCE STATUS: 1 - Symptomatic but completely ambulatory ?Vitals:  ? 12/25/21 1138  ?BP: (!) 131/45  ?Pulse: 73  ?Resp: 20  ?Temp: 98.7 ?F (37.1 ?C)  ?SpO2: 100%  ? ?Filed Weights  ? 12/25/21 1138  ?Weight: 110 lb 1.6 oz (49.9 kg)  ? ? ?Physical Exam ?Constitutional:   ?   General: She is not in acute distress. ?HENT:  ?   Head: Normocephalic and atraumatic.  ?Eyes:  ?   General: No scleral icterus. ?Cardiovascular:  ?   Rate and Rhythm: Normal rate and regular rhythm.  ?   Heart sounds: Normal heart sounds.  ?Pulmonary:  ?   Effort: Pulmonary effort is normal. No respiratory distress.  ?   Breath sounds: No wheezing.  ?Abdominal:  ?   General: Bowel sounds are normal. There is no distension.  ?   Palpations: Abdomen is soft.  ?Musculoskeletal:     ?   General: Normal range of motion.  ?   Cervical back: Normal range of motion and neck supple.  ?   Comments: Prominent interphalangeal joints   ?Skin: ?   General: Skin is warm and dry.  ?   Findings: No erythema or rash.  ?Neurological:  ?   Mental Status: She is alert and oriented to person, place, and time. Mental status is at baseline.  ?   Cranial Nerves: No cranial nerve deficit.  ?   Coordination: Coordination normal.  ?Psychiatric:     ?   Mood and Affect: Mood normal.  ? ? ?LABORATORY DATA:  ?I have reviewed the data as listed ?Lab Results  ?Component Value Date  ? WBC 4.1 12/03/2021  ? HGB 9.6 (L) 12/03/2021  ? HCT 29.8 (L) 12/03/2021  ? MCV 96.4 12/03/2021  ? PLT 167 12/03/2021  ? ?Recent Labs  ?  12/03/21 ?7591  ?NA 135  ?K 3.8  ?CL 103  ?CO2 26  ?GLUCOSE 110*  ?BUN 17  ?CREATININE 1.16*  ?CALCIUM 8.6*  ?GFRNONAA 48*  ?PROT 6.9  ?ALBUMIN 3.7   ?AST 17  ?ALT 8  ?ALKPHOS 70  ?BILITOT 0.1*  ? ? ?Iron/TIBC/Ferritin/ %Sat ?   ?Component Value Date/Time  ? IRON 73 02/02/2012 1444  ? TIBC 290 02/02/2012 1444  ? FERRITIN 152 11/17/2011 1505  ? IRONPCTSAT 25 02/02/2012 1444  ? ?  ? ? ?RADIOGRAPHIC STUDIES: ?I have personally reviewed the radiological images as listed and agreed with  the findings in the report. ?No results found. ? ? ? ?ASSESSMENT & PLAN:  ?1. Normocytic anemia   ?2. Stage 3b chronic kidney disease ([place])   ?3. Elevated serum immunoglobulin free light chain level   ? ?# Anemia due to chronic kidney disease, rule out other etiologies.  ?Labs reviewed and discussed with patient. ?LDH is normal, TSH normal.  Multiple myeloma panel showed negative M protein.  Light chain ratio is slightly elevated.  Nonspecific ?Recommend patient to get 24-hour urine protein electrophoresis for further evaluation. ? ?#Stage IIIb chronic kidney disease, avoid nephrotoxins. ?# positive ANA 1:1280 suspect underlying autoimmune disease.  Refer to [facility] clinic rheumatology Dr. Posey Pronto for further evaluation. ? ?Orders Placed This Encounter  ?Procedures  ? IFE+PROTEIN ELECTRO, 24-HR UR  ?  Standing Status:   Future  ?  Standing Expiration Date:   12/26/2022  ? Ambulatory referral to Rheumatology  ?  Referral Priority:   Routine  ?  Referral Type:   Consultation  ?  Referral Reason:   Specialty Services Required  ?  Referred to Provider:   Quintin Alto, MD  ?  Requested Specialty:   Rheumatology  ?  Number of Visits Requested:   1  ?  ?All questions were answered. The patient knows to call the clinic with any problems questions or concerns. ? ?cc ?[name], MD  ? ? ?Return of visit:  to be determined ?Thank you for this kind referral and the opportunity to participate in the care of this patient. A copy of today's note is routed to referring provider  ? ?Earlie Server, MD, PhD ?[facility] Hematology Oncology ?12/25/2021 ? ? ?

## 2021-12-30 ENCOUNTER — Telehealth: Payer: Self-pay

## 2021-12-30 NOTE — Telephone Encounter (Signed)
Patient already picked up.

## 2021-12-30 NOTE — Telephone Encounter (Signed)
-----   Message from Earlie Server, MD sent at 12/25/2021  6:23 PM EDT ----- ?Please let her know that I recommend her to get 24 hour UPEP testing. Ordered ? ? ?

## 2022-01-01 DIAGNOSIS — N1832 Chronic kidney disease, stage 3b: Secondary | ICD-10-CM | POA: Diagnosis not present

## 2022-01-02 ENCOUNTER — Other Ambulatory Visit: Payer: Self-pay

## 2022-01-02 DIAGNOSIS — R768 Other specified abnormal immunological findings in serum: Secondary | ICD-10-CM

## 2022-01-05 LAB — IFE+PROTEIN ELECTRO, 24-HR UR
% BETA, Urine: 0 %
ALPHA 1 URINE: 0 %
Albumin, U: 100 %
Alpha 2, Urine: 0 %
GAMMA GLOBULIN URINE: 0 %
Total Protein, Urine-Ur/day: 190 mg/24 hr — ABNORMAL HIGH (ref 30–150)
Total Protein, Urine: 11.2 mg/dL
Total Volume: 1700

## 2022-01-07 ENCOUNTER — Telehealth: Payer: Self-pay | Admitting: Emergency Medicine

## 2022-01-07 NOTE — Telephone Encounter (Signed)
Followed up on Rheumatology referral with Dr. Posey Pronto. Pt scheduled for appointment on 01/08/22 @ 1pm ?

## 2022-01-28 ENCOUNTER — Other Ambulatory Visit: Payer: Self-pay | Admitting: Family Medicine

## 2022-01-28 DIAGNOSIS — D631 Anemia in chronic kidney disease: Secondary | ICD-10-CM

## 2022-01-28 DIAGNOSIS — R634 Abnormal weight loss: Secondary | ICD-10-CM

## 2022-02-04 ENCOUNTER — Ambulatory Visit
Admission: RE | Admit: 2022-02-04 | Discharge: 2022-02-04 | Disposition: A | Discharge status: Home / Self Care | Payer: Medicare HMO | Source: Ambulatory Visit | Attending: Family Medicine | Admitting: Family Medicine

## 2022-02-04 DIAGNOSIS — R634 Abnormal weight loss: Secondary | ICD-10-CM | POA: Insufficient documentation

## 2022-02-04 DIAGNOSIS — D631 Anemia in chronic kidney disease: Secondary | ICD-10-CM | POA: Insufficient documentation

## 2022-02-04 DIAGNOSIS — N189 Chronic kidney disease, unspecified: Secondary | ICD-10-CM | POA: Insufficient documentation

## 2022-02-04 LAB — POCT I-STAT CREATININE: Creatinine, Ser: 1.2 mg/dL — ABNORMAL HIGH (ref 0.44–1.00)

## 2022-02-04 MED ORDER — IOHEXOL 300 MG/ML  SOLN
80.0000 mL | Freq: Once | INTRAMUSCULAR | Status: AC | PRN
  Administered 2022-02-04: 80 mL via INTRAVENOUS

## 2022-04-20 ENCOUNTER — Encounter (INDEPENDENT_AMBULATORY_CARE_PROVIDER_SITE_OTHER): Payer: Self-pay | Admitting: Vascular Surgery

## 2022-04-20 ENCOUNTER — Ambulatory Visit (INDEPENDENT_AMBULATORY_CARE_PROVIDER_SITE_OTHER): Payer: Medicare HMO | Admitting: Vascular Surgery

## 2022-04-20 VITALS — BP 155/66 | HR 77 | Resp 16 | Wt 109.0 lb

## 2022-04-20 DIAGNOSIS — I10 Essential (primary) hypertension: Secondary | ICD-10-CM

## 2022-04-20 DIAGNOSIS — K551 Chronic vascular disorders of intestine: Secondary | ICD-10-CM | POA: Diagnosis not present

## 2022-04-20 DIAGNOSIS — E782 Mixed hyperlipidemia: Secondary | ICD-10-CM

## 2022-04-20 DIAGNOSIS — I701 Atherosclerosis of renal artery: Secondary | ICD-10-CM | POA: Diagnosis not present

## 2022-04-20 DIAGNOSIS — I700 Atherosclerosis of aorta: Secondary | ICD-10-CM | POA: Diagnosis not present

## 2022-04-21 ENCOUNTER — Encounter (INDEPENDENT_AMBULATORY_CARE_PROVIDER_SITE_OTHER): Payer: Self-pay | Admitting: Vascular Surgery

## 2022-04-21 DIAGNOSIS — K551 Chronic vascular disorders of intestine: Secondary | ICD-10-CM | POA: Insufficient documentation

## 2022-04-21 DIAGNOSIS — I701 Atherosclerosis of renal artery: Secondary | ICD-10-CM | POA: Insufficient documentation

## 2022-04-21 DIAGNOSIS — I700 Atherosclerosis of aorta: Secondary | ICD-10-CM | POA: Insufficient documentation

## 2022-04-21 NOTE — Progress Notes (Signed)
MRN : [id number]  Kelsey Ibarra is a 76 y.o. (1945/10/26) female who presents with chief complaint of check circulation.  History of Present Illness:  I am asked to evaluate the patient for the complaint of weight loss with uncertain etiology.  The patient is followed the medical service, who is concerned about mesenteric ischemia.  The patient has noted some weight loss but denies nausea.  The patient does substantiate food fear "I just don't eat because I have no appetite", particular foods do not seem to aggravate or alleviate the symptoms.  The patient denies bloody bowel movements or diarrhea.  No history of peptic ulcer disease.   No prior peripheral angiograms or vascular interventions. However, she did have a CT with contrast on 02/04/2022 which shows subtotal occlusion of the SMA and occlusion of the IMA, >90% stenosis of the aorta in the visceral segment and what appear to be >90% stenosis of both renal arteries.  The patient denies amaurosis fugax or recent TIA symptoms. There are no recent neurological changes noted. The patient denies claudication symptoms or rest pain symptoms. The patient denies history of DVT, PE or superficial thrombophlebitis. The patient denies recent episodes of angina     The CT scan is reviewed by me with the patient and her son and I concur with the above findings.   Current Meds  Medication Sig   benazepril (LOTENSIN) 20 MG tablet Take 20 mg by mouth daily.   citalopram (CELEXA) 10 MG tablet Take 10 mg by mouth daily.   meclizine (ANTIVERT) 25 MG tablet Take 25 mg by mouth 3 (three) times daily as needed for dizziness.   nitroGLYCERIN (NITROSTAT) 0.4 MG SL tablet Place 0.4 mg under the tongue every 5 (five) minutes as needed for chest pain.   omeprazole (PRILOSEC) 40 MG capsule Take 40 mg by mouth daily.   pravastatin (PRAVACHOL) 20 MG tablet Take 20 mg by mouth daily.   traMADol (ULTRAM) 50 MG tablet Take 50 mg by mouth 3  (three) times daily as needed.    Past Medical History:  Diagnosis Date   Anemia    Cancer ([place])    skin ca   GERD (gastroesophageal reflux disease)    Hyperlipidemia    Hypertension     Past Surgical History:  Procedure Laterality Date   BACK SURGERY     COLONOSCOPY WITH PROPOFOL N/A 06/03/2015   Procedure: COLONOSCOPY WITH PROPOFOL;  Surgeon: Hulen Luster, MD;  Location: [facility] ENDOSCOPY;  Service: Gastroenterology;  Laterality: N/A;   HEMORROIDECTOMY      Social History Social History   Tobacco Use   Smoking status: Never   Smokeless tobacco: Never  Vaping Use   Vaping Use: Never used  Substance Use Topics   Alcohol use: Never   Drug use: Never    Family History Family History  Problem Relation Age of Onset   Heart attack Father    Lupus Sister    Multiple sclerosis Sister    ALS Brother    Breast cancer Other     Allergies  Allergen Reactions   Neurontin [Gabapentin]    Zocor [Simvastatin]      REVIEW OF SYSTEMS (Negative unless checked)  Constitutional: '[]'$ Weight loss  '[]'$ Fever  '[]'$ Chills Cardiac: '[]'$ Chest pain   '[]'$ Chest pressure   '[]'$ Palpitations   '[]'$ Shortness of breath when laying flat   '[]'$ Shortness of breath with exertion. Vascular:  '[x]'$ Pain  in legs with walking   '[]'$ Pain in legs at rest  '[]'$ History of DVT   '[]'$ Phlebitis   '[]'$ Swelling in legs   '[]'$ Varicose veins   '[]'$ Non-healing ulcers Pulmonary:   '[]'$ Uses home oxygen   '[]'$ Productive cough   '[]'$ Hemoptysis   '[]'$ Wheeze  '[]'$ COPD   '[]'$ Asthma Neurologic:  '[]'$ Dizziness   '[]'$ Seizures   '[]'$ History of stroke   '[]'$ History of TIA  '[]'$ Aphasia   '[]'$ Vissual changes   '[]'$ Weakness or numbness in arm   '[]'$ Weakness or numbness in leg Musculoskeletal:   '[]'$ Joint swelling   '[]'$ Joint pain   '[]'$ Low back pain Hematologic:  '[]'$ Easy bruising  '[]'$ Easy bleeding   '[]'$ Hypercoagulable state   '[]'$ Anemic Gastrointestinal:  '[]'$ Diarrhea   '[]'$ Vomiting  '[]'$ Gastroesophageal reflux/heartburn   '[]'$ Difficulty swallowing. Genitourinary:  '[]'$ Chronic kidney disease   '[]'$ Difficult  urination  '[]'$ Frequent urination   '[]'$ Blood in urine Skin:  '[]'$ Rashes   '[]'$ Ulcers  Psychological:  '[]'$ History of anxiety   '[]'$  History of major depression.  Physical Examination  Vitals:   04/20/22 1545  BP: (!) 155/66  Pulse: 77  Resp: 16  Weight: 109 lb (49.4 kg)   Body mass index is 19.94 kg/m. Gen: WD/WN, NAD very thin with moderate temporal wasting Head: [place]/AT, No temporalis wasting.  Ear/Nose/Throat: Hearing grossly intact, nares w/o erythema or drainage Eyes: PER, EOMI, sclera nonicteric.  Neck: Supple, no masses.  No bruit or JVD.  Pulmonary:  Good air movement, no audible wheezing, no use of accessory muscles.  Cardiac: RRR, normal S1, S2, no Murmurs. Vascular:  mild trophic changes, no open wounds Vessel Right Left  Radial Palpable Palpable  PT Not Palpable Not Palpable  DP Not Palpable Not Palpable  Gastrointestinal: soft, non-distended. No guarding/no peritoneal signs.  Musculoskeletal: M/S 5/5 throughout.  No visible deformity.  Neurologic: CN 2-12 intact. Pain and light touch intact in extremities.  Symmetrical.  Speech is fluent. Motor exam as listed above. Psychiatric: Judgment intact, Mood & affect appropriate for pt's clinical situation. Dermatologic: No rashes or ulcers noted.  No changes consistent with cellulitis.   CBC Lab Results  Component Value Date   WBC 4.1 12/03/2021   HGB 9.6 (L) 12/03/2021   HCT 29.8 (L) 12/03/2021   MCV 96.4 12/03/2021   PLT 167 12/03/2021    BMET    Component Value Date/Time   NA 135 12/03/2021 1607   NA 139 10/15/2014 1716   K 3.8 12/03/2021 1607   K 3.6 10/15/2014 1716   CL 103 12/03/2021 1607   CL 106 10/15/2014 1716   CO2 26 12/03/2021 1607   CO2 28 10/15/2014 1716   GLUCOSE 110 (H) 12/03/2021 1607   GLUCOSE 91 10/15/2014 1716   BUN 17 12/03/2021 1607   BUN 12 10/15/2014 1716   CREATININE 1.20 (H) 02/04/2022 1506   CREATININE 0.86 10/15/2014 1716   CALCIUM 8.6 (L) 12/03/2021 1607   CALCIUM 8.8 10/15/2014 1716    GFRNONAA 48 (L) 12/03/2021 1607   GFRNONAA >60 10/15/2014 1716   GFRAA >60 10/15/2014 1716   CrCl cannot be calculated (Patient's most recent lab result is older than the maximum 21 days allowed.).  COAG No results found for: "INR", "PROTIME"  Radiology No results found.   Assessment/Plan 1. Chronic mesenteric ischemia ([place]) The patient has a significant constellation of vascular problems essentially all within the visceral segment of her abdominal aorta.  I believe she is having significant chronic mesenteric ischemia.  But also evaluating the CT scan she has high risk for renal failure and she  essentially has a subtotal occlusion of her aorta putting her lower extremities at risk.  I explained to the patient and her son that I do not have the resources to treat her with interventional techniques and if indeed a endarterectomy of the visceral aorta is needed that is a service that is not currently provided at [facility].  We therefore discussed referral to [facility] Department of vascular surgery.  I have contacted Dr. Adriana Mccallum, he is agreed to evaluate her, therefore I will make appropriate arrangements.  I have asked the patient to stop by [facility] and have a copy of her CT scan burned to a disc.  2. Renal artery stenosis ([place]) See #1  3. 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 Primary hypertension Continue antihypertensive medications as already ordered, these medications have been reviewed and there are no changes at this time.   5. Mixed hyperlipidemia Continue statin as ordered and reviewed, no changes at this time     Hortencia Pilar, MD  04/21/2022 11:22 AM

## 2022-07-13 ENCOUNTER — Encounter (INDEPENDENT_AMBULATORY_CARE_PROVIDER_SITE_OTHER): Payer: Self-pay

## 2022-08-14 ENCOUNTER — Encounter: Payer: Self-pay | Admitting: *Deleted

## 2022-08-14 ENCOUNTER — Ambulatory Visit: Payer: Medicare HMO | Admitting: Certified Registered"

## 2022-08-14 ENCOUNTER — Encounter
Admission: RE | Disposition: A | Discharge status: Home / Self Care | Payer: Self-pay | Source: Home / Self Care | Attending: Gastroenterology

## 2022-08-14 ENCOUNTER — Ambulatory Visit
Admission: RE | Admit: 2022-08-14 | Discharge: 2022-08-14 | Disposition: A | Discharge status: Home / Self Care | Payer: Medicare HMO | Attending: Gastroenterology | Admitting: Gastroenterology

## 2022-08-14 DIAGNOSIS — K2970 Gastritis, unspecified, without bleeding: Secondary | ICD-10-CM | POA: Diagnosis not present

## 2022-08-14 DIAGNOSIS — K319 Disease of stomach and duodenum, unspecified: Secondary | ICD-10-CM | POA: Insufficient documentation

## 2022-08-14 DIAGNOSIS — E785 Hyperlipidemia, unspecified: Secondary | ICD-10-CM | POA: Insufficient documentation

## 2022-08-14 DIAGNOSIS — I739 Peripheral vascular disease, unspecified: Secondary | ICD-10-CM | POA: Diagnosis not present

## 2022-08-14 DIAGNOSIS — R933 Abnormal findings on diagnostic imaging of other parts of digestive tract: Secondary | ICD-10-CM | POA: Diagnosis present

## 2022-08-14 DIAGNOSIS — K449 Diaphragmatic hernia without obstruction or gangrene: Secondary | ICD-10-CM | POA: Diagnosis not present

## 2022-08-14 DIAGNOSIS — Z8673 Personal history of transient ischemic attack (TIA), and cerebral infarction without residual deficits: Secondary | ICD-10-CM | POA: Insufficient documentation

## 2022-08-14 DIAGNOSIS — I10 Essential (primary) hypertension: Secondary | ICD-10-CM | POA: Insufficient documentation

## 2022-08-14 HISTORY — PX: ESOPHAGOGASTRODUODENOSCOPY (EGD) WITH PROPOFOL: SHX5813

## 2022-08-14 HISTORY — DX: Cerebral infarction, unspecified: I63.9

## 2022-08-14 SURGERY — ESOPHAGOGASTRODUODENOSCOPY (EGD) WITH PROPOFOL
Anesthesia: General

## 2022-08-14 MED ORDER — SODIUM CHLORIDE 0.9 % IV SOLN: INTRAVENOUS | Status: DC

## 2022-08-14 MED ORDER — LIDOCAINE HCL (CARDIAC) PF 100 MG/5ML IV SOSY
PREFILLED_SYRINGE | INTRAVENOUS | Status: DC | PRN
  Administered 2022-08-14: 100 mg via INTRAVENOUS

## 2022-08-14 MED ORDER — PROPOFOL 10 MG/ML IV BOLUS
INTRAVENOUS | Status: DC | PRN
  Administered 2022-08-14: 80 mg via INTRAVENOUS

## 2022-08-14 NOTE — H&P (Signed)
Outpatient short stay form Pre-procedure 08/14/2022  Kelsey Rubenstein, MD  Primary Physician: [name], MD  Reason for visit:  Abnormal imaging  History of present illness:    76 y/o lady with history of CVA, hypertension, and HLD here for EGD due to CT scan showing pyloric thickening. No blood thinners. Denies family history of GI malignancies. History of umbilical hernia repair.    Current Facility-Administered Medications:    0.9 %  sodium chloride infusion, , Intravenous, Continuous, Murphy Duzan, Hilton Cork, MD, Last Rate: 20 mL/hr at 08/14/22 1023, New Bag at 08/14/22 1023  Medications Prior to Admission  Medication Sig Dispense Refill Last Dose   benazepril (LOTENSIN) 20 MG tablet Take 20 mg by mouth daily.   08/14/2022   citalopram (CELEXA) 10 MG tablet Take 10 mg by mouth daily.   08/13/2022   omeprazole (PRILOSEC) 40 MG capsule Take 40 mg by mouth daily.   08/13/2022   pravastatin (PRAVACHOL) 20 MG tablet Take 20 mg by mouth daily.   08/13/2022   traMADol (ULTRAM) 50 MG tablet Take 50 mg by mouth 3 (three) times daily as needed.   08/13/2022   meclizine (ANTIVERT) 25 MG tablet Take 25 mg by mouth 3 (three) times daily as needed for dizziness.      nitroGLYCERIN (NITROSTAT) 0.4 MG SL tablet Place 0.4 mg under the tongue every 5 (five) minutes as needed for chest pain.        Allergies  Allergen Reactions   Neurontin [Gabapentin]    Zocor [Simvastatin]      Past Medical History:  Diagnosis Date   Anemia    Cancer ([place])    skin ca   GERD (gastroesophageal reflux disease)    Hyperlipidemia    Hypertension    Stroke [facility])    10/23 tia    Review of systems:  Otherwise negative.    Physical Exam  Gen: Alert, oriented. Appears stated age.  HEENT:  PERRLA. Lungs: No respiratory distress CV: RRR Abd: soft, benign, no masses Ext: No edema    Planned procedures: Proceed with EGD. The patient understands the nature of the planned procedure, indications,  risks, alternatives and potential complications including but not limited to bleeding, infection, perforation, damage to internal organs and possible oversedation/side effects from anesthesia. The patient agrees and gives consent to proceed.  Please refer to procedure notes for findings, recommendations and patient disposition/instructions.     Kelsey Rubenstein, MD [facility] Gastroenterology

## 2022-08-14 NOTE — Anesthesia Postprocedure Evaluation (Signed)
Anesthesia Post Note  Patient: Kelsey Ibarra  Procedure(s) Performed: ESOPHAGOGASTRODUODENOSCOPY (EGD) WITH PROPOFOL  Patient location during evaluation: Endoscopy Anesthesia Type: General Level of consciousness: awake and alert Pain management: pain level controlled Vital Signs Assessment: post-procedure vital signs reviewed and stable Respiratory status: spontaneous breathing, nonlabored ventilation, respiratory function stable and patient connected to nasal cannula oxygen Cardiovascular status: blood pressure returned to baseline and stable Postop Assessment: no apparent nausea or vomiting Anesthetic complications: no   No notable events documented.   Last Vitals:  Vitals:   08/14/22 1055 08/14/22 1105  BP:  (!) 155/58  Pulse:    Resp:    Temp:    SpO2: 100% 100%    Last Pain:  Vitals:   08/14/22 1105  TempSrc:   PainSc: 0-No pain                 Precious Haws Angelique Chevalier

## 2022-08-14 NOTE — Transfer of Care (Signed)
Immediate Anesthesia Transfer of Care Note  Patient: Kelsey Ibarra  Procedure(s) Performed: ESOPHAGOGASTRODUODENOSCOPY (EGD) WITH PROPOFOL  Patient Location: PACU  Anesthesia Type:General  Level of Consciousness: awake  Airway & Oxygen Therapy: Patient Spontanous Breathing  Post-op Assessment: Report given to RN and Post -op Vital signs reviewed and stable  Post vital signs: Reviewed and stable  Last Vitals:  Vitals Value Taken Time  BP 139/58 08/14/22 1045  Temp    Pulse 67 08/14/22 1046  Resp 16 08/14/22 1046  SpO2 99 % 08/14/22 1046  Vitals shown include unvalidated device data.  Last Pain:  Vitals:   08/14/22 1015  TempSrc: Temporal  PainSc: 0-No pain         Complications: No notable events documented.

## 2022-08-14 NOTE — Op Note (Signed)
[facility] Gastroenterology Patient Name: Kelsey Ibarra Procedure Date: 08/14/2022 10:08 AM MRN: [id number] Account #: [id number] Date of Birth: 1946/08/20 Admit Type: Outpatient Age: 76 Room: [facility] ENDO ROOM 3 Gender: Female Note Status: Finalized Instrument Name: Upper Endoscope [id number] Procedure:             Upper GI endoscopy Indications:           Abnormal CT of the GI tract Providers:             Andrey Farmer MD, MD Referring MD:          Andrey Farmer MD, MD (Referring MD) Medicines:             Monitored Anesthesia Care Complications:         No immediate complications. Estimated blood loss:                         Minimal. Procedure:             Pre-Anesthesia Assessment:                        - Prior to the procedure, a History and Physical was                         performed, and patient medications and allergies were                         reviewed. The patient is competent. The risks and                         benefits of the procedure and the sedation options and                         risks were discussed with the patient. All questions                         were answered and informed consent was obtained.                         Patient identification and proposed procedure were                         verified by the physician, the nurse, the                         anesthesiologist, the anesthetist and the technician                         in the endoscopy suite. Mental Status Examination:                         alert and oriented. Airway Examination: normal                         oropharyngeal airway and neck mobility. Respiratory                         Examination: clear to auscultation. CV Examination:  normal. Prophylactic Antibiotics: The patient does not                         require prophylactic antibiotics. Prior                         Anticoagulants: The patient has taken no anticoagulant                          or antiplatelet agents. ASA Grade Assessment: III - A                         patient with severe systemic disease. After reviewing                         the risks and benefits, the patient was deemed in                         satisfactory condition to undergo the procedure. The                         anesthesia plan was to use monitored anesthesia care                         (MAC). Immediately prior to administration of                         medications, the patient was re-assessed for adequacy                         to receive sedatives. The heart rate, respiratory                         rate, oxygen saturations, blood pressure, adequacy of                         pulmonary ventilation, and response to care were                         monitored throughout the procedure. The physical                         status of the patient was re-assessed after the                         procedure.                        After obtaining informed consent, the endoscope was                         passed under direct vision. Throughout the procedure,                         the patient's blood pressure, pulse, and oxygen                         saturations were monitored continuously. The Endoscope  was introduced through the mouth, and advanced to the                         second part of duodenum. The upper GI endoscopy was                         accomplished without difficulty. The patient tolerated                         the procedure well. Findings:      A small hiatal hernia was present.      Patchy minimal inflammation characterized by erythema was found in the       gastric antrum. Biopsies were taken with a cold forceps for histology.       Estimated blood loss was minimal.      The examined duodenum was normal. Impression:            - Small hiatal hernia.                        - Gastritis. Biopsied.                        - Normal  examined duodenum. Recommendation:        - Discharge patient to home.                        - Resume previous diet.                        - Continue present medications.                        - Await pathology results.                        - Return to referring physician as previously                         scheduled. Procedure Code(s):     --- Professional ---                        [phone], Esophagogastroduodenoscopy, flexible,                         transoral; with biopsy, single or multiple Diagnosis Code(s):     --- Professional ---                        K44.9, Diaphragmatic hernia without obstruction or                         gangrene                        K29.70, Gastritis, unspecified, without bleeding                        R93.3, Abnormal findings on diagnostic imaging of                         other parts of digestive tract CPT copyright  2022 American Medical Association. All rights reserved. The codes documented in this report are preliminary and upon coder review may  be revised to meet current compliance requirements. Andrey Farmer MD, MD 08/14/2022 10:44:29 AM Number of Addenda: 0 Note Initiated On: 08/14/2022 10:08 AM Estimated Blood Loss:  Estimated blood loss was minimal.      [facility]

## 2022-08-14 NOTE — Anesthesia Procedure Notes (Signed)
Procedure Name: MAC Date/Time: 08/14/2022 10:33 AM  Performed by: Biagio Borg, CRNAPre-anesthesia Checklist: Patient identified, Emergency Drugs available, Suction available, Patient being monitored and Timeout performed Patient Re-evaluated:Patient Re-evaluated prior to induction Oxygen Delivery Method: Nasal cannula Induction Type: IV induction Placement Confirmation: positive ETCO2 and CO2 detector

## 2022-08-14 NOTE — Interval H&P Note (Signed)
History and Physical Interval Note:  08/14/2022 10:31 AM  Kelsey Ibarra  has presented today for surgery, with the diagnosis of ABNORMAL CT SCAN GI TRACT.  The various methods of treatment have been discussed with the patient and family. After consideration of risks, benefits and other options for treatment, the patient has consented to  Procedure(s): ESOPHAGOGASTRODUODENOSCOPY (EGD) WITH PROPOFOL (N/A) as a surgical intervention.  The patient's history has been reviewed, patient examined, no change in status, stable for surgery.  I have reviewed the patient's chart and labs.  Questions were answered to the patient's satisfaction.     Lesly Rubenstein  Ok to proceed with EGD

## 2022-08-14 NOTE — Anesthesia Preprocedure Evaluation (Signed)
Anesthesia Evaluation  Patient identified by MRN, date of birth, ID band Patient awake    Reviewed: Allergy & Precautions, NPO status , Patient's Chart, lab work & pertinent test results  History of Anesthesia Complications Negative for: history of anesthetic complications  Airway Mallampati: III  TM Distance: >3 FB Neck ROM: full    Dental  (+) Upper Dentures, Lower Dentures   Pulmonary neg pulmonary ROS, neg shortness of breath   Pulmonary exam normal        Cardiovascular Exercise Tolerance: Good hypertension, (-) angina + Peripheral Vascular Disease  Normal cardiovascular exam     Neuro/Psych CVA  negative psych ROS   GI/Hepatic Neg liver ROS,GERD  Controlled,,  Endo/Other  negative endocrine ROS    Renal/GU negative Renal ROS  negative genitourinary   Musculoskeletal   Abdominal   Peds  Hematology negative hematology ROS (+)   Anesthesia Other Findings Past Medical History: No date: Anemia No date: Cancer (HCC)     Comment:  skin ca No date: GERD (gastroesophageal reflux disease) No date: Hyperlipidemia No date: Hypertension No date: Stroke [facility])     Comment:  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 tia  Past Surgical History: No date: BACK SURGERY 06/03/2015: COLONOSCOPY WITH PROPOFOL; N/A     Comment:  Procedure: COLONOSCOPY WITH PROPOFOL;  Surgeon: Hulen Luster, MD;  Location: [facility] ENDOSCOPY;  Service:               Gastroenterology;  Laterality: N/A; No date: HEMORROIDECTOMY  BMI    Body Mass Index: 19.08 kg/m      Reproductive/Obstetrics negative OB ROS                             Anesthesia Physical Anesthesia Plan  ASA: 3  Anesthesia Plan: General   Post-op Pain Management:    Induction: Intravenous  PONV Risk Score and Plan: Propofol infusion and TIVA  Airway Management Planned: Natural Airway and Nasal Cannula  Additional Equipment:   Intra-op Plan:   Post-operative  Plan:   Informed Consent: I have reviewed the patients History and Physical, chart, labs and discussed the procedure including the risks, benefits and alternatives for the proposed anesthesia with the patient or authorized representative who has indicated his/her understanding and acceptance.     Dental Advisory Given  Plan Discussed with: Anesthesiologist, CRNA and Surgeon  Anesthesia Plan Comments: (Patient consented for risks of anesthesia including but not limited to:  - adverse reactions to medications - risk of airway placement if required - damage to eyes, teeth, lips or other oral mucosa - nerve damage due to positioning  - sore throat or hoarseness - Damage to heart, brain, nerves, lungs, other parts of body or loss of life  Patient voiced understanding.)       Anesthesia Quick Evaluation

## 2022-08-17 LAB — SURGICAL PATHOLOGY

## 2023-03-12 ENCOUNTER — Ambulatory Visit
Admission: RE | Admit: 2023-03-12 | Discharge: 2023-03-12 | Disposition: A | Discharge status: Home / Self Care | Payer: Medicare HMO | Attending: Family Medicine | Admitting: Family Medicine

## 2023-03-12 ENCOUNTER — Other Ambulatory Visit: Payer: Self-pay | Admitting: Family Medicine

## 2023-03-12 ENCOUNTER — Ambulatory Visit
Admission: RE | Admit: 2023-03-12 | Discharge: 2023-03-12 | Disposition: A | Discharge status: Home / Self Care | Payer: Medicare HMO | Source: Ambulatory Visit | Attending: Family Medicine | Admitting: Family Medicine

## 2023-03-12 DIAGNOSIS — M25571 Pain in right ankle and joints of right foot: Secondary | ICD-10-CM

## 2023-05-27 IMAGING — CR DG CHEST 2V
1 series · 2 of 2 positions shown · non-contrast
Comparison: 10/15/2014

CLINICAL DATA: Abnormal weight loss

EXAM:
CHEST - 2 VIEW

[Series 1: dg chest 2 view · 0.14mm/px · 2 of 2 slices shown]
[im 1/2]
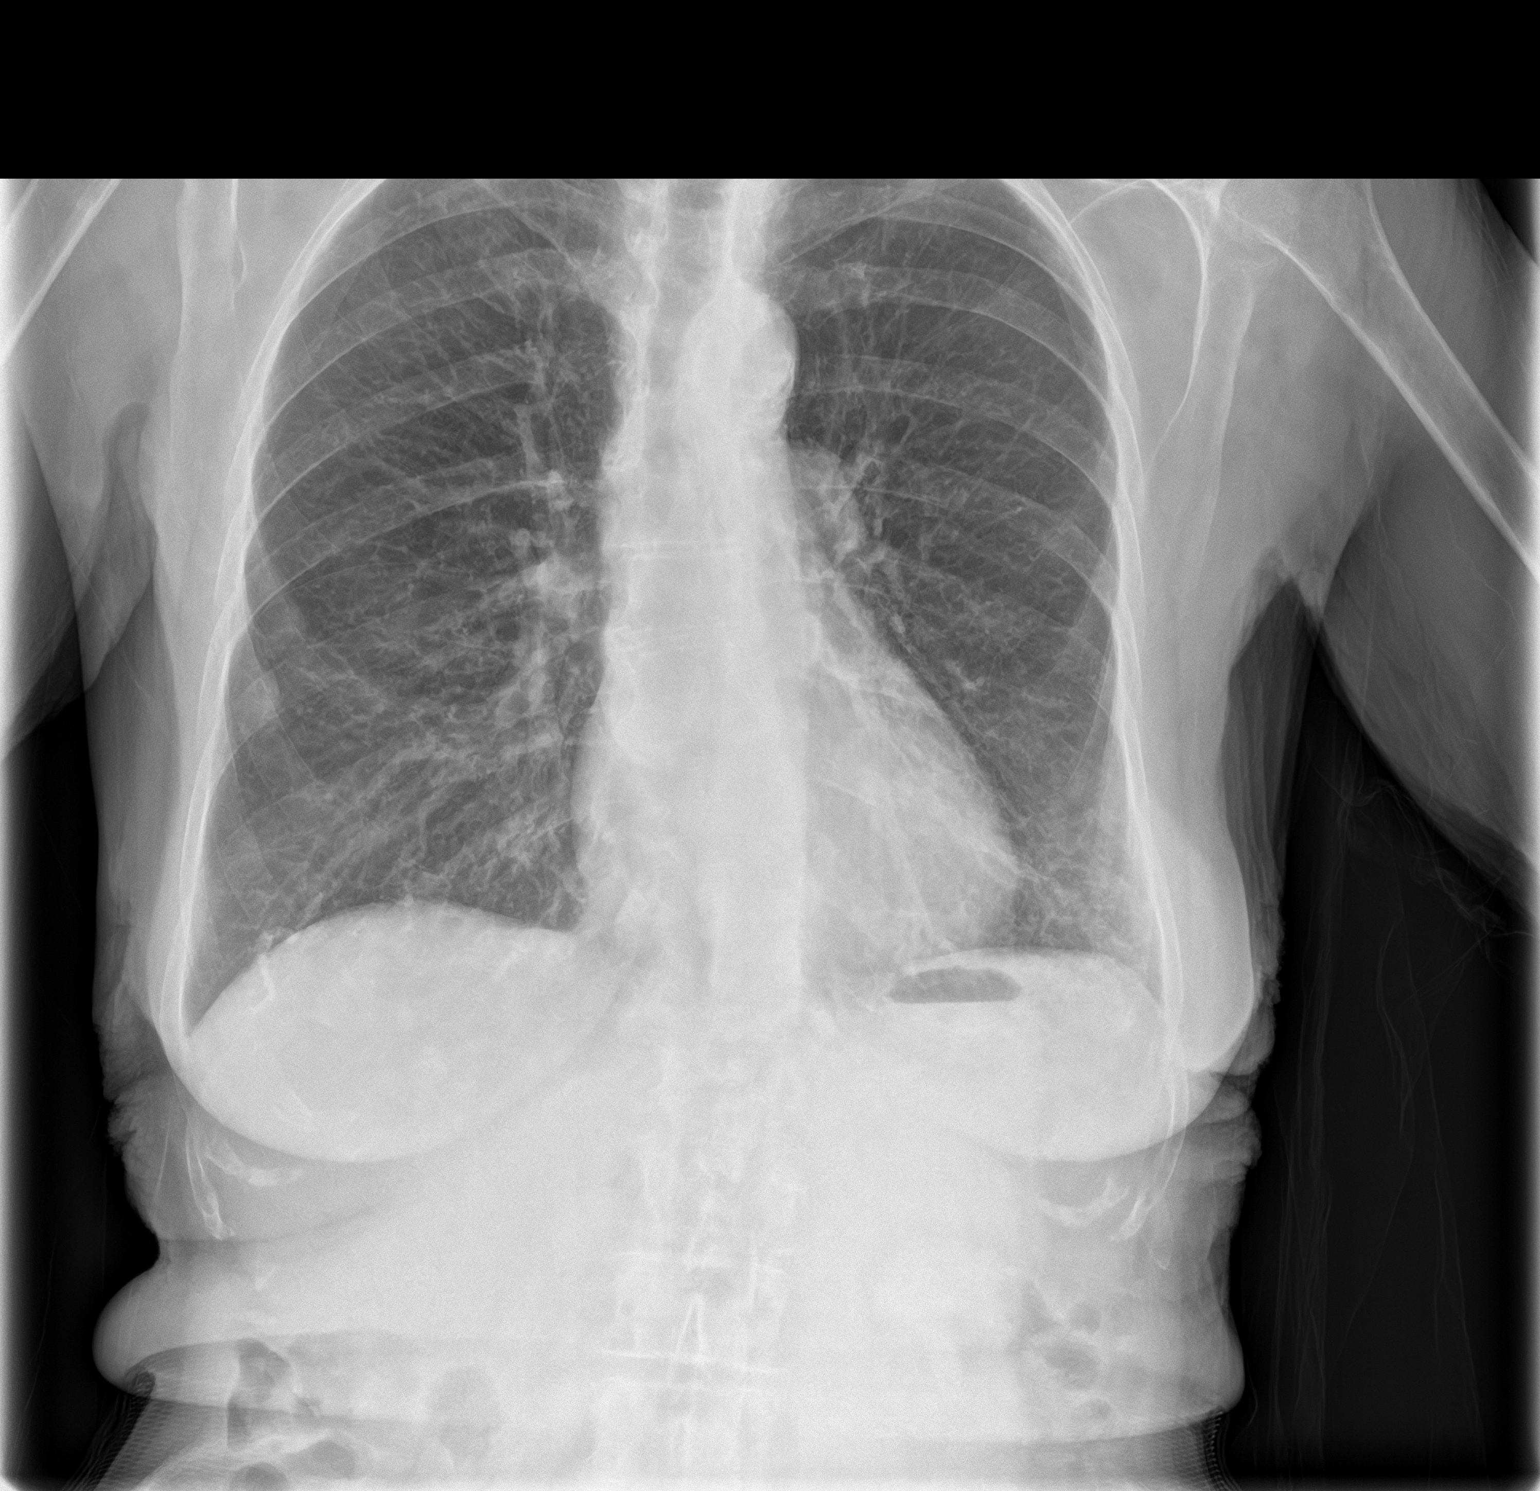
[im 2/2]
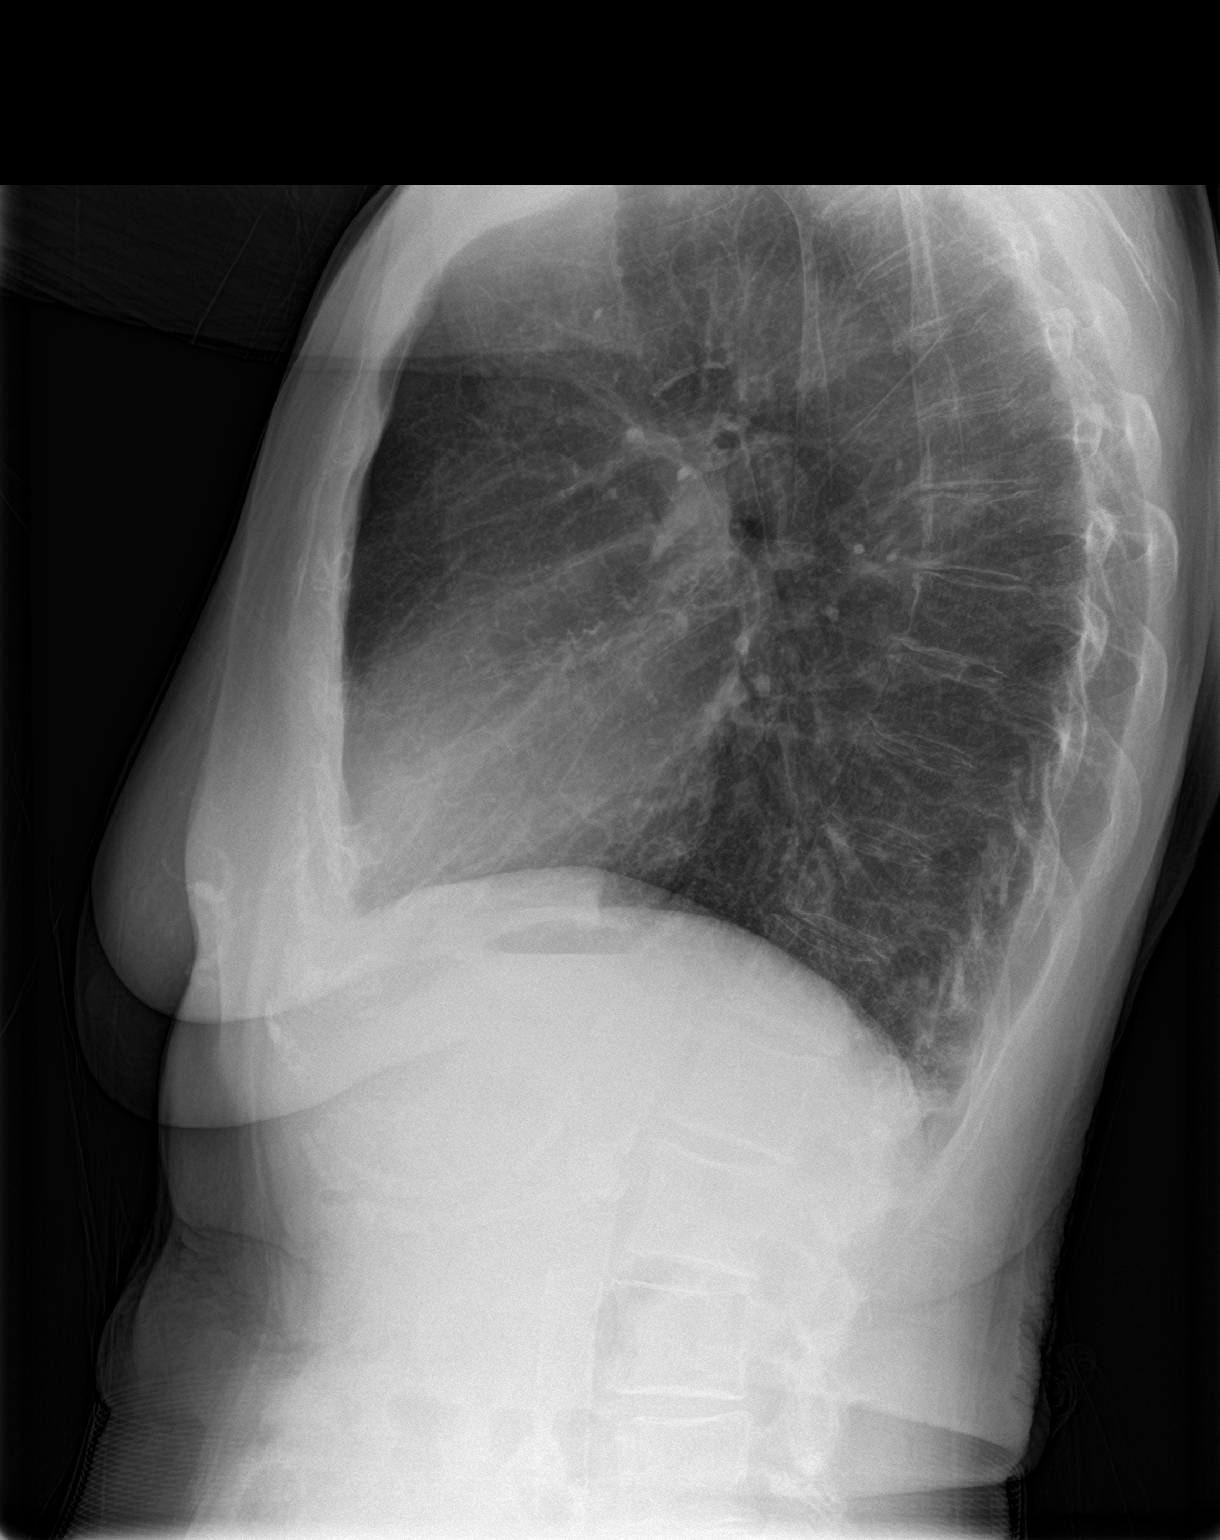

[2 of 2 positions shown; findings below may reference images not displayed]

FINDINGS: Mild reticulation and nodularity at the bases. No acute
consolidation, pleural effusion or pneumothorax. Normal cardiac size
with aortic atherosclerosis. No pneumothorax
IMPRESSION: Mild reticulation and nodularity the bases, corresponds to CT
demonstrated tree-in-bud density and suspicious for respiratory
infection to include atypical organisms.

## 2023-06-16 ENCOUNTER — Encounter: Payer: Self-pay | Admitting: Ophthalmology

## 2023-06-16 NOTE — Anesthesia Preprocedure Evaluation (Addendum)
Anesthesia Evaluation  Patient identified by MRN, date of birth, ID band Patient awake    Reviewed: Allergy & Precautions, H&P , NPO status , Patient's Chart, lab work & pertinent test results  Airway Mallampati: III  TM Distance: >3 FB Neck ROM: Full    Dental no notable dental hx. (+) Upper Dentures, Lower Dentures   Pulmonary neg pulmonary ROS   Pulmonary exam normal breath sounds clear to auscultation       Cardiovascular hypertension, + Peripheral Vascular Disease  Normal cardiovascular exam Rhythm:Regular Rate:Normal     Neuro/Psych  PSYCHIATRIC DISORDERS Anxiety Depression    CVA negative neurological ROS  negative psych ROS   GI/Hepatic Neg liver ROS,GERD  ,,  Endo/Other  negative endocrine ROS    Renal/GU negative Renal ROS  negative genitourinary   Musculoskeletal negative musculoskeletal ROS (+)    Abdominal   Peds negative pediatric ROS (+)  Hematology  (+) Blood dyscrasia, anemia   Anesthesia Other Findings Hypertension  GERD (gastroesophageal reflux disease) Hyperlipidemia  Cancer (HCC) Anemia  Stroke (HCC) Wears dentures     Reproductive/Obstetrics negative OB ROS                              Anesthesia Physical Anesthesia Plan  ASA: 3  Anesthesia Plan: MAC   Post-op Pain Management:    Induction: Intravenous  PONV Risk Score and Plan:   Airway Management Planned: Natural Airway and Nasal Cannula  Additional Equipment:   Intra-op Plan:   Post-operative Plan:   Informed Consent: I have reviewed the patients History and Physical, chart, labs and discussed the procedure including the risks, benefits and alternatives for the proposed anesthesia with the patient or authorized representative who has indicated his/her understanding and acceptance.     Dental Advisory Given  Plan Discussed with: Anesthesiologist, CRNA and Surgeon  Anesthesia Plan  Comments: (Patient consented for risks of anesthesia including but not limited to:  - adverse reactions to medications - damage to eyes, teeth, lips or other oral mucosa - nerve damage due to positioning  - sore throat or hoarseness - Damage to heart, brain, nerves, lungs, other parts of body or loss of life  Patient voiced understanding.)         Anesthesia Quick Evaluation

## 2023-06-21 NOTE — Discharge Instructions (Signed)

## 2023-06-23 ENCOUNTER — Ambulatory Visit: Payer: Medicare HMO | Admitting: Anesthesiology

## 2023-06-23 ENCOUNTER — Encounter: Payer: Self-pay | Admitting: Ophthalmology

## 2023-06-23 ENCOUNTER — Encounter
Admission: RE | Disposition: A | Discharge status: Home / Self Care | Payer: Self-pay | Source: Home / Self Care | Attending: Ophthalmology

## 2023-06-23 ENCOUNTER — Other Ambulatory Visit: Payer: Self-pay

## 2023-06-23 ENCOUNTER — Ambulatory Visit
Admission: RE | Admit: 2023-06-23 | Discharge: 2023-06-23 | Disposition: A | Discharge status: Home / Self Care | Payer: Medicare HMO | Attending: Ophthalmology | Admitting: Ophthalmology

## 2023-06-23 DIAGNOSIS — F32A Depression, unspecified: Secondary | ICD-10-CM | POA: Insufficient documentation

## 2023-06-23 DIAGNOSIS — K219 Gastro-esophageal reflux disease without esophagitis: Secondary | ICD-10-CM | POA: Insufficient documentation

## 2023-06-23 DIAGNOSIS — H2511 Age-related nuclear cataract, right eye: Secondary | ICD-10-CM | POA: Diagnosis present

## 2023-06-23 DIAGNOSIS — I739 Peripheral vascular disease, unspecified: Secondary | ICD-10-CM | POA: Diagnosis not present

## 2023-06-23 DIAGNOSIS — Z79899 Other long term (current) drug therapy: Secondary | ICD-10-CM | POA: Insufficient documentation

## 2023-06-23 DIAGNOSIS — D649 Anemia, unspecified: Secondary | ICD-10-CM | POA: Diagnosis not present

## 2023-06-23 DIAGNOSIS — E785 Hyperlipidemia, unspecified: Secondary | ICD-10-CM | POA: Insufficient documentation

## 2023-06-23 DIAGNOSIS — Z8673 Personal history of transient ischemic attack (TIA), and cerebral infarction without residual deficits: Secondary | ICD-10-CM | POA: Insufficient documentation

## 2023-06-23 DIAGNOSIS — I10 Essential (primary) hypertension: Secondary | ICD-10-CM | POA: Diagnosis not present

## 2023-06-23 DIAGNOSIS — F419 Anxiety disorder, unspecified: Secondary | ICD-10-CM | POA: Diagnosis not present

## 2023-06-23 HISTORY — DX: Presence of dental prosthetic device (complete) (partial): Z97.2

## 2023-06-23 HISTORY — PX: CATARACT EXTRACTION W/PHACO: SHX586

## 2023-06-23 SURGERY — PHACOEMULSIFICATION, CATARACT, WITH IOL INSERTION
Anesthesia: Monitor Anesthesia Care | Site: Eye | Laterality: Right

## 2023-06-23 MED ORDER — BRIMONIDINE TARTRATE-TIMOLOL 0.2-0.5 % OP SOLN
OPHTHALMIC | Status: DC | PRN
  Administered 2023-06-23: 1 [drp] via OPHTHALMIC

## 2023-06-23 MED ORDER — FENTANYL CITRATE (PF) 100 MCG/2ML IJ SOLN
INTRAMUSCULAR | Status: DC | PRN
  Administered 2023-06-23: 25 ug via INTRAVENOUS
  Administered 2023-06-23: 50 ug via INTRAVENOUS

## 2023-06-23 MED ORDER — SODIUM CHLORIDE 0.9% FLUSH: 10.0000 mL | INTRAVENOUS | Status: DC | PRN

## 2023-06-23 MED ORDER — SIGHTPATH DOSE#1 BSS IO SOLN
INTRAOCULAR | Status: DC | PRN
  Administered 2023-06-23: 80 mL via OPHTHALMIC

## 2023-06-23 MED ORDER — ARMC OPHTHALMIC DILATING DROPS
OPHTHALMIC | Status: AC
  Filled 2023-06-23: qty 0.5

## 2023-06-23 MED ORDER — CEFUROXIME OPHTHALMIC INJECTION 1 MG/0.1 ML
INJECTION | OPHTHALMIC | Status: DC | PRN
  Administered 2023-06-23: .1 mL via INTRACAMERAL

## 2023-06-23 MED ORDER — MIDAZOLAM HCL 2 MG/2ML IJ SOLN
INTRAMUSCULAR | Status: DC | PRN
  Administered 2023-06-23: .5 mg via INTRAVENOUS

## 2023-06-23 MED ORDER — SIGHTPATH DOSE#1 BSS IO SOLN
INTRAOCULAR | Status: DC | PRN
  Administered 2023-06-23: 1 mL via INTRAMUSCULAR

## 2023-06-23 MED ORDER — SIGHTPATH DOSE#1 BSS IO SOLN
INTRAOCULAR | Status: DC | PRN
  Administered 2023-06-23: 15 mL

## 2023-06-23 MED ORDER — ARMC OPHTHALMIC DILATING DROPS
1.0000 | OPHTHALMIC | Status: AC
  Administered 2023-06-23 (×3): 1 via OPHTHALMIC

## 2023-06-23 MED ORDER — MIDAZOLAM HCL 2 MG/2ML IJ SOLN
INTRAMUSCULAR | Status: AC
  Filled 2023-06-23: qty 2

## 2023-06-23 MED ORDER — FENTANYL CITRATE (PF) 100 MCG/2ML IJ SOLN
INTRAMUSCULAR | Status: AC
  Filled 2023-06-23: qty 2

## 2023-06-23 MED ORDER — LACTATED RINGERS IV SOLN: INTRAVENOUS | Status: DC

## 2023-06-23 MED ORDER — SIGHTPATH DOSE#1 NA HYALUR & NA CHOND-NA HYALUR IO KIT
PACK | INTRAOCULAR | Status: DC | PRN
  Administered 2023-06-23: 1 via OPHTHALMIC

## 2023-06-23 MED ORDER — TETRACAINE HCL 0.5 % OP SOLN
OPHTHALMIC | Status: AC
  Filled 2023-06-23: qty 4

## 2023-06-23 MED ORDER — TETRACAINE HCL 0.5 % OP SOLN
1.0000 [drp] | OPHTHALMIC | Status: AC
  Administered 2023-06-23 (×3): 1 [drp] via OPHTHALMIC

## 2023-06-23 SURGICAL SUPPLY — 9 items
CATARACT SUITE SIGHTPATH (MISCELLANEOUS) ×1
FEE CATARACT SUITE SIGHTPATH (MISCELLANEOUS) ×1 IMPLANT
GLOVE SRG 8 PF TXTR STRL LF DI (GLOVE) ×1 IMPLANT
GLOVE SURG ENC TEXT LTX SZ7.5 (GLOVE) ×1 IMPLANT
GLOVE SURG UNDER POLY LF SZ8 (GLOVE) ×1
LENS IOL TECNIS EYHANCE 19.5 (Intraocular Lens) IMPLANT
NDL FILTER BLUNT 18X1 1/2 (NEEDLE) ×1 IMPLANT
NEEDLE FILTER BLUNT 18X1 1/2 (NEEDLE) ×1
SYR 3ML LL SCALE MARK (SYRINGE) ×1 IMPLANT

## 2023-06-23 NOTE — Transfer of Care (Signed)
Immediate Anesthesia Transfer of Care Note  Patient: Kelsey Ibarra  Procedure(s) Performed: CATARACT EXTRACTION PHACO AND INTRAOCULAR LENS PLACEMENT (IOC) RIGHT  9.77  00:40.2 (Right: Eye)  Patient Location: PACU  Anesthesia Type: MAC  Level of Consciousness: awake, alert  and patient cooperative  Airway and Oxygen Therapy: Patient Spontanous Breathing and Patient connected to supplemental oxygen  Post-op Assessment: Post-op Vital signs reviewed, Patient's Cardiovascular Status Stable, Respiratory Function Stable, Patent Airway and No signs of Nausea or vomiting  Post-op Vital Signs: Reviewed and stable  Complications: No notable events documented.

## 2023-06-23 NOTE — Op Note (Signed)
LOCATION:  [facility]   PREOPERATIVE DIAGNOSIS:    Nuclear sclerotic cataract right eye. H25.11   POSTOPERATIVE DIAGNOSIS:  Nuclear sclerotic cataract right eye.     PROCEDURE:  Phacoemusification with posterior chamber intraocular lens placement of the right eye   ULTRASOUND TIME: Procedure(s): CATARACT EXTRACTION PHACO AND INTRAOCULAR LENS PLACEMENT (IOC) RIGHT  9.77  00:40.2 (Right)  LENS:   Implant Name Type Inv. Item Serial No. Manufacturer Lot No. LRB No. Used Action  LENS IOL TECNIS EYHANCE 19.5 - [id number] Intraocular Lens LENS IOL TECNIS EYHANCE 19.5 [id number] [employer]  Right 1 Implanted         SURGEON:  Deirdre Evener, MD   ANESTHESIA:  Topical with tetracaine drops and 2% Xylocaine jelly, augmented with 1% preservative-free intracameral lidocaine.    COMPLICATIONS:  None.   DESCRIPTION OF PROCEDURE:  The patient was identified in the holding room and transported to the operating room and placed in the supine position under the operating microscope.  The right eye was identified as the operative eye and it was prepped and draped in the usual sterile ophthalmic fashion.   A 1 millimeter clear-corneal paracentesis was made at the 12:00 position.  0.5 ml of preservative-free 1% lidocaine was injected into the anterior chamber. The anterior chamber was filled with Viscoat viscoelastic.  A 2.4 millimeter keratome was used to make a near-clear corneal incision at the 9:00 position.  A curvilinear capsulorrhexis was made with a cystotome and capsulorrhexis forceps.  Balanced salt solution was used to hydrodissect and hydrodelineate the nucleus.   Phacoemulsification was then used in stop and chop fashion to remove the lens nucleus and epinucleus.  The remaining cortex was then removed using the irrigation and aspiration handpiece. Provisc was then placed into the capsular bag to distend it for lens placement.  A lens was then injected into the capsular bag.   The remaining viscoelastic was aspirated.   Wounds were hydrated with balanced salt solution.  The anterior chamber was inflated to a physiologic pressure with balanced salt solution.  No wound leaks were noted. Cefuroxime 0.1 ml of a 10mg /ml solution was injected into the anterior chamber for a dose of 1 mg of intracameral antibiotic at the completion of the case.   Timolol and Brimonidine drops were applied to the eye.  The patient was taken to the recovery room in stable condition without complications of anesthesia or surgery.   Doyle Kunath 06/23/2023, 1:55 PM

## 2023-06-23 NOTE — H&P (Signed)
[facility]   Primary Care Physician:  Dortha Kern, MD Ophthalmologist: Dr. Lockie Mola  Pre-Procedure History & Physical: HPI:  Kelsey Ibarra is a 77 y.o. female here for ophthalmic surgery.   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   Wears dentures    full upper and lower    Past Surgical History:  Procedure Laterality Date   BACK SURGERY     COLONOSCOPY WITH PROPOFOL N/A 06/03/2015   Procedure: COLONOSCOPY WITH PROPOFOL;  Surgeon: Wallace Cullens, MD;  Location: [facility] ENDOSCOPY;  Service: Gastroenterology;  Laterality: N/A;   ESOPHAGOGASTRODUODENOSCOPY (EGD) WITH PROPOFOL N/A 08/14/2022   Procedure: ESOPHAGOGASTRODUODENOSCOPY (EGD) WITH PROPOFOL;  Surgeon: Regis Bill, MD;  Location: [facility] ENDOSCOPY;  Service: Endoscopy;  Laterality: N/A;   HEMORROIDECTOMY      Prior to Admission medications   Medication Sig Start Date End Date Taking? Authorizing Provider  benazepril (LOTENSIN) 20 MG tablet Take 20 mg by mouth daily.   Yes [provider]  citalopram (CELEXA) 10 MG tablet Take 10 mg by mouth daily. 11/18/21  Yes [provider]  memantine (NAMENDA) 10 MG tablet Take 10 mg by mouth 2 (two) times daily.   Yes [provider]  omeprazole (PRILOSEC) 40 MG capsule Take 40 mg by mouth daily. 11/08/21  Yes [provider]  pravastatin (PRAVACHOL) 20 MG tablet Take 20 mg by mouth daily. 07/27/21  Yes [provider]  traMADol (ULTRAM) 50 MG tablet Take 50 mg by mouth 3 (three) times daily as needed. 12/02/21  Yes [provider]  meclizine (ANTIVERT) 25 MG tablet Take 25 mg by mouth 3 (three) times daily as needed for dizziness. Patient not taking: Reported on 06/16/2023    [provider]  nitroGLYCERIN (NITROSTAT) 0.4 MG SL tablet Place 0.4 mg under the tongue  every 5 (five) minutes as needed for chest pain. Patient not taking: Reported on 06/16/2023    [provider]    Allergies as of 06/02/2023 - Review Complete 08/14/2022  Allergen Reaction Noted   Neurontin [gabapentin]  05/31/2015   Zocor [simvastatin]  05/31/2015    Family History  Problem Relation Age of Onset   Heart attack Father    Lupus Sister    Multiple sclerosis Sister    ALS Brother    Breast cancer Other     Social History   Socioeconomic History   Marital status: Married    Spouse name: Not on file   Number of children: Not on file   Years of education: Not on file   Highest education level: Not on file  Occupational History   Occupation: Comptroller  Tobacco Use   Smoking status: Never   Smokeless tobacco: Never  Vaping Use   Vaping status: Never Used  Substance and Sexual Activity   Alcohol use: Never   Drug use: Never   Sexual activity: Not Currently  Other Topics Concern   Not on file  Social History Narrative   Not on file   Social Determinants of Health   Financial Resource Strain: Not on file  Food Insecurity: Not on file  Transportation Needs: Not on file  Physical Activity: Not on file  Stress: Not on file  Social Connections: Not on file  Intimate Partner Violence:  Not on file    Review of Systems: See HPI, otherwise negative ROS  Physical Exam: BP (!) 158/65   Pulse 70   Temp (!) 97.2 F (36.2 C) (Temporal)   Resp 13   Ht 5' (1.524 m)   Wt 43.4 kg   SpO2 100%   BMI 18.67 kg/m  General:   Alert,  pleasant and cooperative in NAD Head:  Normocephalic and atraumatic. Lungs:  Clear to auscultation.    Heart:  Regular rate and rhythm.   Impression/Plan: Kelsey Ibarra is here for ophthalmic surgery.  Risks, benefits, limitations, and alternatives regarding ophthalmic surgery have been reviewed with the patient.  Questions have been answered.  All parties agreeable.   Lockie Mola, MD  06/23/2023, 12:12 PM

## 2023-06-23 NOTE — Anesthesia Postprocedure Evaluation (Signed)
Anesthesia Post Note  Patient: Melayah Skorupski Pendry  Procedure(s) Performed: CATARACT EXTRACTION PHACO AND INTRAOCULAR LENS PLACEMENT (IOC) RIGHT  9.77  00:40.2 (Right: Eye)  Patient location during evaluation: PACU Anesthesia Type: MAC Level of consciousness: awake and alert Pain management: pain level controlled Vital Signs Assessment: post-procedure vital signs reviewed and stable Respiratory status: spontaneous breathing, nonlabored ventilation, respiratory function stable and patient connected to nasal cannula oxygen Cardiovascular status: stable and blood pressure returned to baseline Postop Assessment: no apparent nausea or vomiting Anesthetic complications: no   No notable events documented.   Last Vitals:  Vitals:   06/23/23 1357 06/23/23 1406  BP: (!) 104/54 (!) 96/58  Pulse: 74 74  Resp: 11 12  Temp: (!) 36.3 C (!) 36.3 C  SpO2: 99% 96%    Last Pain:  Vitals:   06/23/23 1406  TempSrc:   PainSc: 0-No pain                 Tehani Mersman C Nadie Fiumara

## 2023-06-24 ENCOUNTER — Encounter: Payer: Self-pay | Admitting: Ophthalmology

## 2023-07-01 NOTE — Anesthesia Preprocedure Evaluation (Addendum)
Anesthesia Evaluation  Patient identified by MRN, date of birth, ID band Patient awake    Reviewed: Allergy & Precautions, NPO status , Patient's Chart, lab work & pertinent test results  History of Anesthesia Complications Negative for: history of anesthetic complications  Airway Mallampati: III   Neck ROM: Full    Dental  (+) Edentulous Upper, Edentulous Lower   Pulmonary neg pulmonary ROS   Pulmonary exam normal breath sounds clear to auscultation       Cardiovascular hypertension, + Peripheral Vascular Disease  Normal cardiovascular exam Rhythm:Regular Rate:Normal     Neuro/Psych  PSYCHIATRIC DISORDERS Anxiety Depression    TIA   GI/Hepatic ,GERD  ,,  Endo/Other  negative endocrine ROS    Renal/GU negative Renal ROS     Musculoskeletal   Abdominal   Peds  Hematology  (+) Blood dyscrasia, anemia   Anesthesia Other Findings   Reproductive/Obstetrics                             Anesthesia Physical Anesthesia Plan  ASA: 3  Anesthesia Plan: MAC   Post-op Pain Management:    Induction: Intravenous  PONV Risk Score and Plan: 2 and Treatment may vary due to age or medical condition, Midazolam and TIVA  Airway Management Planned: Natural Airway and Nasal Cannula  Additional Equipment:   Intra-op Plan:   Post-operative Plan:   Informed Consent: I have reviewed the patients History and Physical, chart, labs and discussed the procedure including the risks, benefits and alternatives for the proposed anesthesia with the patient or authorized representative who has indicated his/her understanding and acceptance.     Dental advisory given  Plan Discussed with: CRNA  Anesthesia Plan Comments: (LMA/GETA backup discussed.  Patient consented for risks of anesthesia including but not limited to:  - adverse reactions to medications - damage to eyes, teeth, lips or other oral mucosa -  nerve damage due to positioning  - sore throat or hoarseness - damage to heart, brain, nerves, lungs, other parts of body or loss of life  Informed patient about role of CRNA in peri- and intra-operative care.  Patient voiced understanding.)       Anesthesia Quick Evaluation

## 2023-07-05 NOTE — Discharge Instructions (Signed)

## 2023-07-07 ENCOUNTER — Ambulatory Visit: Payer: Medicare HMO | Admitting: Anesthesiology

## 2023-07-07 ENCOUNTER — Encounter
Admission: RE | Disposition: A | Discharge status: Home / Self Care | Payer: Self-pay | Source: Home / Self Care | Attending: Ophthalmology

## 2023-07-07 ENCOUNTER — Ambulatory Visit
Admission: RE | Admit: 2023-07-07 | Discharge: 2023-07-07 | Disposition: A | Discharge status: Home / Self Care | Payer: Medicare HMO | Attending: Ophthalmology | Admitting: Ophthalmology

## 2023-07-07 ENCOUNTER — Encounter: Payer: Self-pay | Admitting: Ophthalmology

## 2023-07-07 ENCOUNTER — Other Ambulatory Visit: Payer: Self-pay

## 2023-07-07 DIAGNOSIS — I10 Essential (primary) hypertension: Secondary | ICD-10-CM | POA: Insufficient documentation

## 2023-07-07 DIAGNOSIS — H2512 Age-related nuclear cataract, left eye: Secondary | ICD-10-CM | POA: Diagnosis present

## 2023-07-07 DIAGNOSIS — K219 Gastro-esophageal reflux disease without esophagitis: Secondary | ICD-10-CM | POA: Diagnosis not present

## 2023-07-07 HISTORY — PX: CATARACT EXTRACTION W/PHACO: SHX586

## 2023-07-07 SURGERY — PHACOEMULSIFICATION, CATARACT, WITH IOL INSERTION
Anesthesia: Monitor Anesthesia Care | Site: Eye | Laterality: Left

## 2023-07-07 MED ORDER — MIDAZOLAM HCL 2 MG/2ML IJ SOLN
INTRAMUSCULAR | Status: AC
  Filled 2023-07-07: qty 2

## 2023-07-07 MED ORDER — SIGHTPATH DOSE#1 BSS IO SOLN
INTRAOCULAR | Status: DC | PRN
  Administered 2023-07-07: 15 mL

## 2023-07-07 MED ORDER — ARMC OPHTHALMIC DILATING DROPS
1.0000 | OPHTHALMIC | Status: AC
  Administered 2023-07-07 (×3): 1 via OPHTHALMIC

## 2023-07-07 MED ORDER — SIGHTPATH DOSE#1 BSS IO SOLN
INTRAOCULAR | Status: DC | PRN
  Administered 2023-07-07: 1 mL

## 2023-07-07 MED ORDER — SODIUM CHLORIDE 0.9% FLUSH: 10.0000 mL | Freq: Two times a day (BID) | INTRAVENOUS | Status: DC

## 2023-07-07 MED ORDER — FENTANYL CITRATE (PF) 100 MCG/2ML IJ SOLN
INTRAMUSCULAR | Status: AC
  Filled 2023-07-07: qty 2

## 2023-07-07 MED ORDER — SIGHTPATH DOSE#1 BSS IO SOLN
INTRAOCULAR | Status: DC | PRN
  Administered 2023-07-07: 73 mL via OPHTHALMIC

## 2023-07-07 MED ORDER — FENTANYL CITRATE (PF) 100 MCG/2ML IJ SOLN
INTRAMUSCULAR | Status: DC | PRN
  Administered 2023-07-07: 50 ug via INTRAVENOUS

## 2023-07-07 MED ORDER — BRIMONIDINE TARTRATE-TIMOLOL 0.2-0.5 % OP SOLN
OPHTHALMIC | Status: DC | PRN
  Administered 2023-07-07: 1 [drp] via OPHTHALMIC

## 2023-07-07 MED ORDER — MIDAZOLAM HCL 2 MG/2ML IJ SOLN
INTRAMUSCULAR | Status: DC | PRN
  Administered 2023-07-07: 1 mg via INTRAVENOUS

## 2023-07-07 MED ORDER — SIGHTPATH DOSE#1 NA HYALUR & NA CHOND-NA HYALUR IO KIT
PACK | INTRAOCULAR | Status: DC | PRN
  Administered 2023-07-07: 1 via OPHTHALMIC

## 2023-07-07 MED ORDER — TETRACAINE HCL 0.5 % OP SOLN
1.0000 [drp] | OPHTHALMIC | Status: AC
  Administered 2023-07-07 (×3): 1 [drp] via OPHTHALMIC

## 2023-07-07 MED ORDER — TETRACAINE HCL 0.5 % OP SOLN
OPHTHALMIC | Status: AC
  Filled 2023-07-07: qty 4

## 2023-07-07 MED ORDER — CEFUROXIME OPHTHALMIC INJECTION 1 MG/0.1 ML
INJECTION | OPHTHALMIC | Status: DC | PRN
  Administered 2023-07-07: .1 mL via INTRACAMERAL

## 2023-07-07 MED ORDER — ARMC OPHTHALMIC DILATING DROPS
OPHTHALMIC | Status: AC
  Filled 2023-07-07: qty 0.5

## 2023-07-07 SURGICAL SUPPLY — 18 items
CANNULA ANT/CHMB 27G (MISCELLANEOUS) IMPLANT
CANNULA ANT/CHMB 27GA (MISCELLANEOUS)
CATARACT SUITE SIGHTPATH (MISCELLANEOUS) ×1
FEE CATARACT SUITE SIGHTPATH (MISCELLANEOUS) ×1 IMPLANT
GLOVE SRG 8 PF TXTR STRL LF DI (GLOVE) ×1 IMPLANT
GLOVE SURG ENC TEXT LTX SZ7.5 (GLOVE) ×1 IMPLANT
GLOVE SURG GAMMEX PI TX LF 7.5 (GLOVE) IMPLANT
GLOVE SURG UNDER POLY LF SZ8 (GLOVE) ×1
LENS IOL TECNIS EYHANCE 20.0 (Intraocular Lens) IMPLANT
NDL FILTER BLUNT 18X1 1/2 (NEEDLE) ×1 IMPLANT
NDL RETROBULBAR .5 NSTRL (NEEDLE) IMPLANT
NEEDLE FILTER BLUNT 18X1 1/2 (NEEDLE) ×1
PACK VIT ANT 23G (MISCELLANEOUS) IMPLANT
RING MALYGIN 7.0 (MISCELLANEOUS) IMPLANT
SUT ETHILON 10-0 CS-B-6CS-B-6 (SUTURE)
SUT VICRYL 9 0 (SUTURE) IMPLANT
SUTURE EHLN 10-0 CS-B-6CS-B-6 (SUTURE) IMPLANT
SYR 3ML LL SCALE MARK (SYRINGE) ×1 IMPLANT

## 2023-07-07 NOTE — Op Note (Signed)
OPERATIVE NOTE  Kelsey Ibarra [id number] 07/07/2023   PREOPERATIVE DIAGNOSIS:  Nuclear sclerotic cataract left eye. H25.12   POSTOPERATIVE DIAGNOSIS:    Nuclear sclerotic cataract left eye.     PROCEDURE:  Phacoemusification with posterior chamber intraocular lens placement of the left eye  Ultrasound time: Procedure(s) with comments: CATARACT EXTRACTION PHACO AND INTRAOCULAR LENS PLACEMENT (IOC) LEFT (Left) - 11.60 1:32.9  LENS:   Implant Name Type Inv. Item Serial No. Manufacturer Lot No. LRB No. Used Action  LENS IOL TECNIS EYHANCE 20.0 - [id number] Intraocular Lens LENS IOL TECNIS EYHANCE 20.0 [id number] [employer]  Left 1 Implanted      SURGEON:  Deirdre Evener, MD   ANESTHESIA:  Topical with tetracaine drops and 2% Xylocaine jelly, augmented with 1% preservative-free intracameral lidocaine.    COMPLICATIONS:  None.   DESCRIPTION OF PROCEDURE:  The patient was identified in the holding room and transported to the operating room and placed in the supine position under the operating microscope.  The left eye was identified as the operative eye and it was prepped and draped in the usual sterile ophthalmic fashion.   A 1 millimeter clear-corneal paracentesis was made at the 1:30 position.  0.5 ml of preservative-free 1% lidocaine was injected into the anterior chamber.  The anterior chamber was filled with Viscoat viscoelastic.  A 2.4 millimeter keratome was used to make a near-clear corneal incision at the 10:30 position.  .  A curvilinear capsulorrhexis was made with a cystotome and capsulorrhexis forceps.  Balanced salt solution was used to hydrodissect and hydrodelineate the nucleus.   Phacoemulsification was then used in stop and chop fashion to remove the lens nucleus and epinucleus.  The remaining cortex was then removed using the irrigation and aspiration handpiece. Provisc was then placed into the capsular bag to distend it for lens placement.  A lens was then  injected into the capsular bag.  The remaining viscoelastic was aspirated.   Wounds were hydrated with balanced salt solution.  The anterior chamber was inflated to a physiologic pressure with balanced salt solution.  No wound leaks were noted. Cefuroxime 0.1 ml of a 10mg /ml solution was injected into the anterior chamber for a dose of 1 mg of intracameral antibiotic at the completion of the case.   Timolol and Brimonidine drops were applied to the eye.  The patient was taken to the recovery room in stable condition without complications of anesthesia or surgery.  [name] 07/07/2023, 9:48 AM

## 2023-07-07 NOTE — H&P (Signed)
[facility]   Primary Care Physician:  Dortha Kern, MD Ophthalmologist: Dr. Lockie Mola  Pre-Procedure History & Physical: HPI:  Kelsey Ibarra is a 77 y.o. female here for ophthalmic surgery.   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   Wears dentures    full upper and lower    Past Surgical History:  Procedure Laterality Date   BACK SURGERY     CATARACT EXTRACTION W/PHACO Right 06/23/2023   Procedure: CATARACT EXTRACTION PHACO AND INTRAOCULAR LENS PLACEMENT (IOC) RIGHT  9.77  00:40.2;  Surgeon: Lockie Mola, MD;  Location: [facility];  Service: Ophthalmology;  Laterality: Right;   COLONOSCOPY WITH PROPOFOL N/A 06/03/2015   Procedure: COLONOSCOPY WITH PROPOFOL;  Surgeon: Wallace Cullens, MD;  Location: [facility] END[facility];  Service: Gastroenterology;  Laterality: N/A;   ESOPHAGOGASTRODUODENOSCOPY (EGD) WITH PROPOFOL N/A 08/14/2022   Procedure: ESOPHAGOGASTRODUODENOSCOPY (EGD) WITH PROPOFOL;  Surgeon: Regis Bill, MD;  Location: [facility] ENDOSCOPY;  Service: Endoscopy;  Laterality: N/A;   HEMORROIDECTOMY      Prior to Admission medications   Medication Sig Start Date End Date Taking? Authorizing Provider  benazepril (LOTENSIN) 20 MG tablet Take 20 mg by mouth daily.   Yes [provider]  citalopram (CELEXA) 10 MG tablet Take 10 mg by mouth daily. 11/18/21  Yes [provider]  memantine (NAMENDA) 10 MG tablet Take 10 mg by mouth 2 (two) times daily.   Yes [provider]  omeprazole (PRILOSEC) 40 MG capsule Take 40 mg by mouth daily. 11/08/21  Yes [provider]  pravastatin (PRAVACHOL) 20 MG tablet Take 20 mg by mouth daily. 07/27/21  Yes [provider]  traMADol (ULTRAM) 50 MG tablet Take 50 mg by mouth 3 (three) times daily as needed. 12/02/21  Yes [provider]  meclizine (ANTIVERT) 25 MG tablet Take 25 mg by mouth 3 (three) times daily as needed for dizziness. Patient not taking: Reported on 06/16/2023    [provider]  nitroGLYCERIN (NITROSTAT) 0.4 MG SL tablet Place 0.4 mg under the tongue every 5 (five) minutes as needed for chest pain. Patient not taking: Reported on 06/16/2023    [provider]    Allergies as of 06/02/2023 - Review Complete 08/14/2022  Allergen Reaction Noted   Neurontin [gabapentin]  05/31/2015   Zocor [simvastatin]  05/31/2015    Family History  Problem Relation Age of Onset   Heart attack Father    Lupus Sister    Multiple sclerosis Sister    ALS Brother    Breast cancer Other     Social History   Socioeconomic History   Marital status: Married    Spouse name: Not on file   Number of children: Not on file   Years of education: Not on file   Highest education level: Not on file  Occupational History   Occupation: sitter  Tobacco Use   Smoking status: Never   Smokeless tobacco: Never  Vaping Use   Vaping status: Never Used  Substance and Sexual Activity   Alcohol use: Never   Drug use: Never   Sexual activity: Not Currently  Other Topics Concern   Not on file  Social History Narrative   Not on file   Social Determinants of Health  Financial Resource Strain: Not on file  Food Insecurity: Not on file  Transportation Needs: Not on file  Physical Activity: Not on file  Stress: Not on file  Social Connections: Not on file  Intimate Partner Violence: Not on file    Review of Systems: See HPI, otherwise negative ROS  Physical Exam: BP (!) 168/68   Pulse 70   Temp 97.7 F (36.5 C) (Temporal)   Resp 14   Ht 5' (1.524 m)   Wt 43 kg   SpO2 98%   BMI 18.51 kg/m  General:   Alert,  pleasant and cooperative in NAD Head:  Normocephalic and atraumatic. Lungs:  Clear to auscultation.    Heart:  Regular rate and rhythm.   Impression/Plan: Kelsey Ibarra is  here for ophthalmic surgery.  Risks, benefits, limitations, and alternatives regarding ophthalmic surgery have been reviewed with the patient.  Questions have been answered.  All parties agreeable.   Lockie Mola, MD  07/07/2023, 9:02 AM

## 2023-07-07 NOTE — Anesthesia Postprocedure Evaluation (Signed)
Anesthesia Post Note  Patient: Kelsey Ibarra  Procedure(s) Performed: CATARACT EXTRACTION PHACO AND INTRAOCULAR LENS PLACEMENT (IOC) LEFT (Left: Eye)  Patient location during evaluation: PACU Anesthesia Type: MAC Level of consciousness: awake and alert, oriented and patient cooperative Pain management: pain level controlled Vital Signs Assessment: post-procedure vital signs reviewed and stable Respiratory status: spontaneous breathing, nonlabored ventilation and respiratory function stable Cardiovascular status: blood pressure returned to baseline and stable Postop Assessment: adequate PO intake Anesthetic complications: no   No notable events documented.   Last Vitals:  Vitals:   07/07/23 0949 07/07/23 0955  BP: 126/61 (!) 150/58  Pulse: 65   Resp: 20   Temp: 36.6 C 36.6 C  SpO2: 97% 100%    Last Pain:  Vitals:   07/07/23 0955  TempSrc:   PainSc: 0-No pain                 Reed Breech

## 2023-07-07 NOTE — Transfer of Care (Signed)
Immediate Anesthesia Transfer of Care Note  Patient: Kelsey Ibarra  Procedure(s) Performed: CATARACT EXTRACTION PHACO AND INTRAOCULAR LENS PLACEMENT (IOC) LEFT (Left: Eye)  Patient Location: PACU  Anesthesia Type: MAC  Level of Consciousness: awake, alert  and patient cooperative  Airway and Oxygen Therapy: Patient Spontanous Breathing and Patient connected to supplemental oxygen  Post-op Assessment: Post-op Vital signs reviewed, Patient's Cardiovascular Status Stable, Respiratory Function Stable, Patent Airway and No signs of Nausea or vomiting  Post-op Vital Signs: Reviewed and stable  Complications: No notable events documented.

## 2023-07-08 ENCOUNTER — Encounter: Payer: Self-pay | Admitting: Ophthalmology
